# Patient Record
Sex: Male | Born: 1950 | Race: White | Hispanic: No | Smoking: Never smoker
Health system: Southern US, Community
[De-identification: ages and names within clinical notes are randomized; demographics above are authoritative.]

## PROBLEM LIST (undated history)

## (undated) DIAGNOSIS — Z973 Presence of spectacles and contact lenses: Secondary | ICD-10-CM

## (undated) DIAGNOSIS — G4730 Sleep apnea, unspecified: Secondary | ICD-10-CM

## (undated) DIAGNOSIS — R55 Syncope and collapse: Secondary | ICD-10-CM

## (undated) DIAGNOSIS — N400 Enlarged prostate without lower urinary tract symptoms: Secondary | ICD-10-CM

## (undated) DIAGNOSIS — M1990 Unspecified osteoarthritis, unspecified site: Secondary | ICD-10-CM

## (undated) DIAGNOSIS — Z8489 Family history of other specified conditions: Secondary | ICD-10-CM

## (undated) DIAGNOSIS — I10 Essential (primary) hypertension: Secondary | ICD-10-CM

## (undated) DIAGNOSIS — U071 COVID-19: Secondary | ICD-10-CM

## (undated) HISTORY — DX: Sleep apnea, unspecified: G47.30

## (undated) HISTORY — PX: APPENDECTOMY: SHX54

## (undated) HISTORY — PX: CHOLECYSTECTOMY: SHX55

## (undated) HISTORY — PX: OTHER SURGICAL HISTORY: SHX169

## (undated) HISTORY — PX: TONSILLECTOMY: SUR1361

## (undated) HISTORY — DX: Syncope and collapse: R55

---

## 2003-03-03 DIAGNOSIS — C801 Malignant (primary) neoplasm, unspecified: Secondary | ICD-10-CM

## 2003-03-03 HISTORY — DX: Malignant (primary) neoplasm, unspecified: C80.1

## 2003-07-14 HISTORY — PX: OTHER SURGICAL HISTORY: SHX169

## 2016-01-13 ENCOUNTER — Ambulatory Visit: Payer: Self-pay | Admitting: Sports Medicine

## 2016-01-27 ENCOUNTER — Ambulatory Visit (INDEPENDENT_AMBULATORY_CARE_PROVIDER_SITE_OTHER): Payer: BLUE CROSS/BLUE SHIELD | Admitting: Sports Medicine

## 2016-01-27 ENCOUNTER — Encounter: Payer: Self-pay | Admitting: Sports Medicine

## 2016-01-27 ENCOUNTER — Ambulatory Visit
Admission: RE | Admit: 2016-01-27 | Discharge: 2016-01-27 | Disposition: A | Discharge status: Home / Self Care | Payer: BLUE CROSS/BLUE SHIELD | Source: Ambulatory Visit | Attending: Sports Medicine | Admitting: Sports Medicine

## 2016-01-27 VITALS — BP 134/89 | HR 68 | Ht 73.0 in | Wt 273.0 lb

## 2016-01-27 DIAGNOSIS — G8929 Other chronic pain: Secondary | ICD-10-CM

## 2016-01-27 DIAGNOSIS — M25511 Pain in right shoulder: Secondary | ICD-10-CM

## 2016-01-27 MED ORDER — MELOXICAM 15 MG PO TABS: ORAL_TABLET | ORAL | 0 refills | Status: DC
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
## 2016-01-31 ENCOUNTER — Telehealth: Payer: Self-pay | Admitting: Sports Medicine

## 2016-01-31 NOTE — Telephone Encounter (Signed)
I spoke with Elta Guadeloupe on the phone today after reviewing the x-ray of his right shoulder. 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 He also has avascular necrosis of the right humeral head. He has yet to try his meloxicam. Based on his x-rays, I've recommended that he try to avoid overhead activities at the gym. He enjoys weight lifting, but will be limited somewhat in what he's able to do. He can also substitute over-the-counter Motrin, ibuprofen, or Aleve for the meloxicam if he would like. He is also asking about experimenting with tart cherry juice and tumeric. I think those are fine. I've also encouraged him to continue with his range of motion exercises. Definitive treatment here is a total shoulder arthroplasty or possibly a reverse total shoulder arthroplasty. He is not interested in surgery at this time. We could also consider intra-articular cortisone injections at a later date if needed. He will follow-up with me as needed.

## 2016-05-28 ENCOUNTER — Other Ambulatory Visit (HOSPITAL_COMMUNITY): Payer: Self-pay | Admitting: Internal Medicine

## 2016-05-28 DIAGNOSIS — R1011 Right upper quadrant pain: Secondary | ICD-10-CM

## 2016-05-29 ENCOUNTER — Ambulatory Visit (HOSPITAL_COMMUNITY)
Admission: RE | Admit: 2016-05-29 | Discharge: 2016-05-29 | Disposition: A | Discharge status: Home / Self Care | Payer: BLUE CROSS/BLUE SHIELD | Source: Ambulatory Visit | Attending: Internal Medicine | Admitting: Internal Medicine

## 2016-05-29 DIAGNOSIS — K760 Fatty (change of) liver, not elsewhere classified: Secondary | ICD-10-CM | POA: Insufficient documentation

## 2016-05-29 DIAGNOSIS — R1011 Right upper quadrant pain: Secondary | ICD-10-CM | POA: Diagnosis not present

## 2016-05-29 DIAGNOSIS — K8020 Calculus of gallbladder without cholecystitis without obstruction: Secondary | ICD-10-CM | POA: Diagnosis not present

## 2016-06-04 ENCOUNTER — Other Ambulatory Visit: Payer: Self-pay | Admitting: Surgery

## 2017-12-23 DIAGNOSIS — Z85820 Personal history of malignant melanoma of skin: Secondary | ICD-10-CM | POA: Diagnosis not present

## 2017-12-23 DIAGNOSIS — L814 Other melanin hyperpigmentation: Secondary | ICD-10-CM | POA: Diagnosis not present

## 2017-12-23 DIAGNOSIS — D225 Melanocytic nevi of trunk: Secondary | ICD-10-CM | POA: Diagnosis not present

## 2017-12-23 DIAGNOSIS — L570 Actinic keratosis: Secondary | ICD-10-CM | POA: Diagnosis not present

## 2018-01-14 DIAGNOSIS — R972 Elevated prostate specific antigen [PSA]: Secondary | ICD-10-CM | POA: Diagnosis not present

## 2018-01-26 DIAGNOSIS — R351 Nocturia: Secondary | ICD-10-CM | POA: Diagnosis not present

## 2018-01-26 DIAGNOSIS — Z87442 Personal history of urinary calculi: Secondary | ICD-10-CM | POA: Diagnosis not present

## 2018-01-26 DIAGNOSIS — N5201 Erectile dysfunction due to arterial insufficiency: Secondary | ICD-10-CM | POA: Diagnosis not present

## 2018-01-26 DIAGNOSIS — N401 Enlarged prostate with lower urinary tract symptoms: Secondary | ICD-10-CM | POA: Diagnosis not present

## 2018-01-26 DIAGNOSIS — R972 Elevated prostate specific antigen [PSA]: Secondary | ICD-10-CM | POA: Diagnosis not present

## 2018-03-28 DIAGNOSIS — Z0000 Encounter for general adult medical examination without abnormal findings: Secondary | ICD-10-CM | POA: Diagnosis not present

## 2018-03-28 DIAGNOSIS — Z125 Encounter for screening for malignant neoplasm of prostate: Secondary | ICD-10-CM | POA: Diagnosis not present

## 2018-03-28 DIAGNOSIS — I10 Essential (primary) hypertension: Secondary | ICD-10-CM | POA: Diagnosis not present

## 2018-03-28 DIAGNOSIS — Z1159 Encounter for screening for other viral diseases: Secondary | ICD-10-CM | POA: Diagnosis not present

## 2018-04-06 DIAGNOSIS — Z91041 Radiographic dye allergy status: Secondary | ICD-10-CM | POA: Diagnosis not present

## 2018-04-06 DIAGNOSIS — N200 Calculus of kidney: Secondary | ICD-10-CM | POA: Diagnosis not present

## 2018-04-06 DIAGNOSIS — Z880 Allergy status to penicillin: Secondary | ICD-10-CM | POA: Diagnosis not present

## 2018-04-06 DIAGNOSIS — N529 Male erectile dysfunction, unspecified: Secondary | ICD-10-CM | POA: Diagnosis not present

## 2018-04-06 DIAGNOSIS — Z85820 Personal history of malignant melanoma of skin: Secondary | ICD-10-CM | POA: Diagnosis not present

## 2018-04-06 DIAGNOSIS — I444 Left anterior fascicular block: Secondary | ICD-10-CM | POA: Diagnosis not present

## 2018-04-06 DIAGNOSIS — Z0000 Encounter for general adult medical examination without abnormal findings: Secondary | ICD-10-CM | POA: Diagnosis not present

## 2018-04-06 DIAGNOSIS — I10 Essential (primary) hypertension: Secondary | ICD-10-CM | POA: Diagnosis not present

## 2018-04-06 DIAGNOSIS — Z23 Encounter for immunization: Secondary | ICD-10-CM | POA: Diagnosis not present

## 2018-04-06 DIAGNOSIS — E785 Hyperlipidemia, unspecified: Secondary | ICD-10-CM | POA: Diagnosis not present

## 2018-08-10 DIAGNOSIS — Z23 Encounter for immunization: Secondary | ICD-10-CM | POA: Diagnosis not present

## 2018-09-28 DIAGNOSIS — R399 Unspecified symptoms and signs involving the genitourinary system: Secondary | ICD-10-CM | POA: Diagnosis not present

## 2018-09-28 DIAGNOSIS — N390 Urinary tract infection, site not specified: Secondary | ICD-10-CM | POA: Diagnosis not present

## 2018-11-21 DIAGNOSIS — D038 Melanoma in situ of other sites: Secondary | ICD-10-CM | POA: Diagnosis not present

## 2018-11-21 DIAGNOSIS — I10 Essential (primary) hypertension: Secondary | ICD-10-CM | POA: Diagnosis not present

## 2018-11-21 DIAGNOSIS — Z1211 Encounter for screening for malignant neoplasm of colon: Secondary | ICD-10-CM | POA: Diagnosis not present

## 2018-11-23 DIAGNOSIS — Z23 Encounter for immunization: Secondary | ICD-10-CM | POA: Diagnosis not present

## 2018-12-01 DIAGNOSIS — D120 Benign neoplasm of cecum: Secondary | ICD-10-CM | POA: Diagnosis not present

## 2018-12-01 DIAGNOSIS — Z1211 Encounter for screening for malignant neoplasm of colon: Secondary | ICD-10-CM | POA: Diagnosis not present

## 2018-12-01 DIAGNOSIS — K5730 Diverticulosis of large intestine without perforation or abscess without bleeding: Secondary | ICD-10-CM | POA: Diagnosis not present

## 2018-12-01 DIAGNOSIS — K635 Polyp of colon: Secondary | ICD-10-CM | POA: Diagnosis not present

## 2018-12-05 DIAGNOSIS — H40053 Ocular hypertension, bilateral: Secondary | ICD-10-CM | POA: Diagnosis not present

## 2018-12-05 DIAGNOSIS — H52203 Unspecified astigmatism, bilateral: Secondary | ICD-10-CM | POA: Diagnosis not present

## 2018-12-05 DIAGNOSIS — H1851 Endothelial corneal dystrophy: Secondary | ICD-10-CM | POA: Diagnosis not present

## 2018-12-05 DIAGNOSIS — H25813 Combined forms of age-related cataract, bilateral: Secondary | ICD-10-CM | POA: Diagnosis not present

## 2018-12-14 DIAGNOSIS — L814 Other melanin hyperpigmentation: Secondary | ICD-10-CM | POA: Diagnosis not present

## 2018-12-14 DIAGNOSIS — L570 Actinic keratosis: Secondary | ICD-10-CM | POA: Diagnosis not present

## 2018-12-14 DIAGNOSIS — D2261 Melanocytic nevi of right upper limb, including shoulder: Secondary | ICD-10-CM | POA: Diagnosis not present

## 2018-12-14 DIAGNOSIS — L821 Other seborrheic keratosis: Secondary | ICD-10-CM | POA: Diagnosis not present

## 2018-12-14 DIAGNOSIS — D225 Melanocytic nevi of trunk: Secondary | ICD-10-CM | POA: Diagnosis not present

## 2018-12-14 DIAGNOSIS — D1801 Hemangioma of skin and subcutaneous tissue: Secondary | ICD-10-CM | POA: Diagnosis not present

## 2019-04-05 DIAGNOSIS — I10 Essential (primary) hypertension: Secondary | ICD-10-CM | POA: Diagnosis not present

## 2019-04-05 DIAGNOSIS — E785 Hyperlipidemia, unspecified: Secondary | ICD-10-CM | POA: Diagnosis not present

## 2019-04-05 DIAGNOSIS — Z125 Encounter for screening for malignant neoplasm of prostate: Secondary | ICD-10-CM | POA: Diagnosis not present

## 2019-04-12 DIAGNOSIS — Z23 Encounter for immunization: Secondary | ICD-10-CM | POA: Diagnosis not present

## 2019-04-12 DIAGNOSIS — Z0000 Encounter for general adult medical examination without abnormal findings: Secondary | ICD-10-CM | POA: Diagnosis not present

## 2019-04-12 DIAGNOSIS — M25511 Pain in right shoulder: Secondary | ICD-10-CM | POA: Diagnosis not present

## 2019-04-12 DIAGNOSIS — Z6835 Body mass index (BMI) 35.0-35.9, adult: Secondary | ICD-10-CM | POA: Diagnosis not present

## 2019-04-12 DIAGNOSIS — I10 Essential (primary) hypertension: Secondary | ICD-10-CM | POA: Diagnosis not present

## 2019-05-24 DIAGNOSIS — R972 Elevated prostate specific antigen [PSA]: Secondary | ICD-10-CM | POA: Diagnosis not present

## 2019-06-01 DIAGNOSIS — R972 Elevated prostate specific antigen [PSA]: Secondary | ICD-10-CM | POA: Diagnosis not present

## 2019-06-01 DIAGNOSIS — N5201 Erectile dysfunction due to arterial insufficiency: Secondary | ICD-10-CM | POA: Diagnosis not present

## 2019-06-01 DIAGNOSIS — Z87442 Personal history of urinary calculi: Secondary | ICD-10-CM | POA: Diagnosis not present

## 2019-06-01 DIAGNOSIS — N401 Enlarged prostate with lower urinary tract symptoms: Secondary | ICD-10-CM | POA: Diagnosis not present

## 2019-06-01 DIAGNOSIS — R351 Nocturia: Secondary | ICD-10-CM | POA: Diagnosis not present

## 2019-07-10 DIAGNOSIS — M25551 Pain in right hip: Secondary | ICD-10-CM | POA: Diagnosis not present

## 2019-07-22 DIAGNOSIS — M1611 Unilateral primary osteoarthritis, right hip: Secondary | ICD-10-CM | POA: Diagnosis not present

## 2019-08-07 DIAGNOSIS — M25551 Pain in right hip: Secondary | ICD-10-CM | POA: Diagnosis not present

## 2019-08-07 DIAGNOSIS — M1611 Unilateral primary osteoarthritis, right hip: Secondary | ICD-10-CM | POA: Diagnosis not present

## 2019-08-23 DIAGNOSIS — M25551 Pain in right hip: Secondary | ICD-10-CM | POA: Diagnosis not present

## 2019-09-11 DIAGNOSIS — M25551 Pain in right hip: Secondary | ICD-10-CM | POA: Diagnosis not present

## 2019-11-09 DIAGNOSIS — M25551 Pain in right hip: Secondary | ICD-10-CM | POA: Diagnosis not present

## 2019-11-09 DIAGNOSIS — M1611 Unilateral primary osteoarthritis, right hip: Secondary | ICD-10-CM | POA: Diagnosis not present

## 2019-11-29 DIAGNOSIS — N401 Enlarged prostate with lower urinary tract symptoms: Secondary | ICD-10-CM | POA: Diagnosis not present

## 2019-11-29 DIAGNOSIS — R351 Nocturia: Secondary | ICD-10-CM | POA: Diagnosis not present

## 2019-11-29 DIAGNOSIS — R3912 Poor urinary stream: Secondary | ICD-10-CM | POA: Diagnosis not present

## 2019-12-08 DIAGNOSIS — H04123 Dry eye syndrome of bilateral lacrimal glands: Secondary | ICD-10-CM | POA: Diagnosis not present

## 2019-12-08 DIAGNOSIS — H5213 Myopia, bilateral: Secondary | ICD-10-CM | POA: Diagnosis not present

## 2019-12-08 DIAGNOSIS — H52203 Unspecified astigmatism, bilateral: Secondary | ICD-10-CM | POA: Diagnosis not present

## 2019-12-08 DIAGNOSIS — H25813 Combined forms of age-related cataract, bilateral: Secondary | ICD-10-CM | POA: Diagnosis not present

## 2019-12-15 DIAGNOSIS — Z85820 Personal history of malignant melanoma of skin: Secondary | ICD-10-CM | POA: Diagnosis not present

## 2019-12-15 DIAGNOSIS — D1801 Hemangioma of skin and subcutaneous tissue: Secondary | ICD-10-CM | POA: Diagnosis not present

## 2019-12-15 DIAGNOSIS — L570 Actinic keratosis: Secondary | ICD-10-CM | POA: Diagnosis not present

## 2019-12-15 DIAGNOSIS — L821 Other seborrheic keratosis: Secondary | ICD-10-CM | POA: Diagnosis not present

## 2019-12-15 DIAGNOSIS — D225 Melanocytic nevi of trunk: Secondary | ICD-10-CM | POA: Diagnosis not present

## 2019-12-28 DIAGNOSIS — N401 Enlarged prostate with lower urinary tract symptoms: Secondary | ICD-10-CM | POA: Diagnosis not present

## 2020-01-05 DIAGNOSIS — R972 Elevated prostate specific antigen [PSA]: Secondary | ICD-10-CM | POA: Diagnosis not present

## 2020-01-05 DIAGNOSIS — R3912 Poor urinary stream: Secondary | ICD-10-CM | POA: Diagnosis not present

## 2020-01-05 DIAGNOSIS — R351 Nocturia: Secondary | ICD-10-CM | POA: Diagnosis not present

## 2020-01-05 DIAGNOSIS — N401 Enlarged prostate with lower urinary tract symptoms: Secondary | ICD-10-CM | POA: Diagnosis not present

## 2020-03-06 DIAGNOSIS — R3912 Poor urinary stream: Secondary | ICD-10-CM | POA: Diagnosis not present

## 2020-03-06 DIAGNOSIS — N401 Enlarged prostate with lower urinary tract symptoms: Secondary | ICD-10-CM | POA: Diagnosis not present

## 2020-03-07 ENCOUNTER — Other Ambulatory Visit: Payer: Self-pay | Admitting: Urology

## 2020-03-28 DIAGNOSIS — M25551 Pain in right hip: Secondary | ICD-10-CM | POA: Diagnosis not present

## 2020-03-28 DIAGNOSIS — M6281 Muscle weakness (generalized): Secondary | ICD-10-CM | POA: Diagnosis not present

## 2020-04-02 DIAGNOSIS — M25551 Pain in right hip: Secondary | ICD-10-CM | POA: Diagnosis not present

## 2020-04-02 DIAGNOSIS — M6281 Muscle weakness (generalized): Secondary | ICD-10-CM | POA: Diagnosis not present

## 2020-04-04 DIAGNOSIS — M25551 Pain in right hip: Secondary | ICD-10-CM | POA: Diagnosis not present

## 2020-04-04 DIAGNOSIS — M6281 Muscle weakness (generalized): Secondary | ICD-10-CM | POA: Diagnosis not present

## 2020-04-09 ENCOUNTER — Encounter (HOSPITAL_BASED_OUTPATIENT_CLINIC_OR_DEPARTMENT_OTHER): Payer: Self-pay | Admitting: Urology

## 2020-04-09 ENCOUNTER — Other Ambulatory Visit: Payer: Self-pay

## 2020-04-09 NOTE — Progress Notes (Addendum)
Spoke w/ via phone for pre-op interview---pt Lab needs dos---- I stat  ekg              Lab results------none COVID test ------covid positive 03-19-2020 results on chart Arrive at -------530 am 04-12-2020 NPO after MN NO Solid Food.  Clear liquids from MN until---430 am then npo Medications to take morning of surgery -----none Diabetic medication -----n/a Patient Special Instructions -----overnight stay instructions given Pre-Op special Istructions -----none Patient verbalized understanding of instructions that were given at this phone interview. Patient denies shortness of breath, chest pain, fever, cough at this phone interview.

## 2020-04-10 DIAGNOSIS — M6281 Muscle weakness (generalized): Secondary | ICD-10-CM | POA: Diagnosis not present

## 2020-04-10 DIAGNOSIS — M25551 Pain in right hip: Secondary | ICD-10-CM | POA: Diagnosis not present

## 2020-04-11 DIAGNOSIS — M6281 Muscle weakness (generalized): Secondary | ICD-10-CM | POA: Diagnosis not present

## 2020-04-11 DIAGNOSIS — M25551 Pain in right hip: Secondary | ICD-10-CM | POA: Diagnosis not present

## 2020-04-11 NOTE — Anesthesia Preprocedure Evaluation (Addendum)
Anesthesia Evaluation  Patient identified by MRN, date of birth, ID band Patient awake    Reviewed: Allergy & Precautions, NPO status , Patient's Chart, lab work & pertinent test results  History of Anesthesia Complications Negative for: history of anesthetic complications  Airway Mallampati: II  TM Distance: >3 FB Neck ROM: Full    Dental no notable dental hx.    Pulmonary Current Smoker,    Pulmonary exam normal        Cardiovascular hypertension, Pt. on medications Normal cardiovascular exam     Neuro/Psych negative neurological ROS  negative psych ROS   GI/Hepatic negative GI ROS, Neg liver ROS,   Endo/Other  negative endocrine ROS  Renal/GU negative Renal ROS  negative genitourinary   Musculoskeletal  (+) Arthritis ,   Abdominal   Peds  Hematology negative hematology ROS (+)   Anesthesia Other Findings Day of surgery medications reviewed with patient.  Reproductive/Obstetrics negative OB ROS                            Anesthesia Physical Anesthesia Plan  ASA: II  Anesthesia Plan: General   Post-op Pain Management:    Induction: Intravenous  PONV Risk Score and Plan: 2 and Treatment may vary due to age or medical condition, Ondansetron, Dexamethasone and Midazolam  Airway Management Planned: LMA  Additional Equipment: None  Intra-op Plan:   Post-operative Plan: Extubation in OR  Informed Consent: I have reviewed the patients History and Physical, chart, labs and discussed the procedure including the risks, benefits and alternatives for the proposed anesthesia with the patient or authorized representative who has indicated his/her understanding and acceptance.     Dental advisory given  Plan Discussed with: CRNA  Anesthesia Plan Comments:        Anesthesia Quick Evaluation

## 2020-04-11 NOTE — H&P (Signed)
I have an enlarged prostate (follow-up).  HPI: Douglas Carney is a 70 year-old male established patient who is here for an enlarged prostate follow-up evaluation.    GU Hx: Douglas Carney returns today in f/u. His PSA was 4.09 on 04/05/19 with Dr. Shelia Carney but it is down to 3.31. He has a history of an elevated PSA of 3.36 in 8/10 which was a rise from 1.1 in 2009 and had a biopsy in 12/10 that was benign. His PSA prior to this visit is 3.03 which is stable. He is voiding well without complaints. He has gained back some weight. He has ED and is using sildenafil 20mg  2 tabs prn with success.   His brother has a history of prostate cancer that was diagnosed at age 83 and treated by Dr. Rosana Carney with [facility].   He has had 2 prior stones which he passed. He has no stone symptoms or hematuria since his last visit. He has had no associated signs or symptoms   Interval 01/05/20: Patient with above-noted history. He returns today for follow-up. He was started on alfuzosin at prior office visit. He states he has noted significant improvement to lower urinary tract symptoms. He is noted improvement in the strength of his urinary stream and feels he is emptying his bladder more efficiently. He states that he does continue to have some postvoid dribbling and spraying of his urinary stream with voiding. Daytime frequency and urgency have improved. Nocturia is also back to x1, which is baseline. He denies dysuria or gross hematuria. No complaints of fever, chills, nausea, or vomiting. PSA was noted to be decreased to 2.80.   03/06/20: Douglas Carney returns today for a prostate [place] and cystoscopy for further evaluation of his LUTS. He remains on alfuzosin. He reports and spraying stream and some post void dribbling. It is better on alfuzosin. His Prostate volume on [place] today is 21ml.     CC: AUA Questions Scoring.  HPI:     AUA Symptom Score: Less than 20% of the time he has the sensation of not emptying his bladder completely when finished  urinating. Less than 50% of the time he has to urinate again fewer than two hours after he has finished urinating. 50% of the time he has to start and stop again several times when he urinates. Less than 50% of the time he finds it difficult to postpone urination. 50% of the time he has a weak urinary stream. Less than 50% of the time he has to push or strain to begin urination. He has to get up to urinate 2 times from the time he goes to bed until the time he gets up in the morning.   Calculated AUA Symptom Score: 15    ALLERGIES: Contrast Dye Penicillins - Skin Rash, childhood allergy, has had since without problem per pt    MEDICATIONS: Alfuzosin Hcl Er 10 mg tablet, extended release 24 hr 1 tablet PO Daily  Aspirin 325 MG Oral Tablet Oral PRN  Ibuprofen TABS Oral PRN  Losartan Potassium 50 mg tablet  Meloxicam 15 mg tablet  Sildenafil Citrate 20 mg tablet 1-5 tablets as needed     GU PSH: Complex Uroflow - 01/05/2020       PSH Notes: Tonsillectomy, Appendectomy, Shoulder Surgery   NON-GU PSH: Appendectomy - 2010 Cholecystectomy (laparoscopic) Remove Tonsils - 2010     GU PMH: BPH w/LUTS - 01/05/2020, I am going try him on alfuzosin and reviewed the side effects. He will return in about  a month with a PSA for a flowrate and PVR. , - 11/29/2019, 2016-06-14 Elevated PSA - 01/05/2020, Douglas Carney's PSA is back down to a more baseline level. I will have him return in a year with a PSA. , - 06/01/2019 (Stable), His PSA is stable and his exam is benign. , - 01/26/2018 (Worsening), His PSA is up to 4.1 but his exam is benign. I will repeat the PSA after a week of abstinence. If it is still up I will get an MRIP and consider a repeat biopsy. , 2016-06-14 (Improving), PSA is down and exam is benign., June 15, 2015, Elevated prostate specific antigen (PSA), - 2014-06-15 Nocturia - 01/05/2020, - 11/29/2019, He has nocturia x 1 since his cholecystectomy. , 06-14-2016 Weak Urinary Stream - 01/05/2020, - 11/29/2019 Microscopic  hematuria - 2015/06/15 ED due to arterial insufficiency, Erectile dysfunction due to arterial insufficiency - June 15, 2014 History of urolithiasis, Nephrolithiasis - June 14, 2012      PMH Notes:  2012-12-24 05:23:40 - Note: Cholelithiasis  2008-11-16 12:26:42 - Note: Osteomyelitis  2008-11-16 12:31:50 - Note: Arm Swelling Right   NON-GU PMH: Encounter for general adult medical examination without abnormal findings, Encounter for preventive health examination - 2014-06-15 Personal history of other diseases of the circulatory system, History of hypertension - 2012/06/14 Personal history of other diseases of the musculoskeletal system and connective tissue, History of low back pain - 2012/06/14    FAMILY HISTORY: Death In The Family Father - Runs In Family Death In The Family Mother - Runs In Family Family Health Status Number - Runs In Family Malignant Melanoma Of The Skin - Father non-Hodgkin's lymphoma - Mother Prostate Cancer - Brother    Notes: Father was an ENT Dr.   SOCIAL HISTORY: Marital Status: Single Preferred Language: English; Ethnicity: Not Hispanic Or Latino; Race: White Current Smoking Status: Patient has never smoked.   Tobacco Use Assessment Completed: Used Tobacco in last 30 days?     Notes: Never A Smoker, Occupation:, Alcohol Use, Tobacco Use   REVIEW OF SYSTEMS:    GU Review Male:   Patient denies frequent urination, hard to postpone urination, burning/ pain with urination, get up at night to urinate, leakage of urine, stream starts and stops, trouble starting your stream, have to strain to urinate , erection problems, and penile pain.  Gastrointestinal (Upper):   Patient denies nausea, vomiting, and indigestion/ heartburn.  Gastrointestinal (Lower):   Patient denies diarrhea and constipation.  Constitutional:   Patient denies fever, night sweats, weight loss, and fatigue.  Skin:   Patient denies skin rash/ lesion and itching.  Eyes:   Patient denies blurred vision and double vision.  Ears/ Nose/  Throat:   Patient denies sore throat and sinus problems.  Hematologic/Lymphatic:   Patient denies swollen glands and easy bruising.  Cardiovascular:   Patient denies leg swelling and chest pains.  Respiratory:   Patient denies cough and shortness of breath.  Endocrine:   Patient denies excessive thirst.  Musculoskeletal:   Patient denies back pain and joint pain.  Neurological:   Patient denies headaches and dizziness.  Psychologic:   Patient denies depression and anxiety.   Notes: weak stream dribbling    VITAL SIGNS:      03/06/2020 08:55 AM  BP 146/84 mmHg  Heart Rate 66 /min  Temperature 98.0 F / 36.6 C   MULTI-SYSTEM PHYSICAL EXAMINATION:    Constitutional: Well-nourished. No physical deformities. Normally developed. Good grooming.  Respiratory: Normal breath sounds. No labored breathing, no use  of accessory muscles.   Cardiovascular: Regular rate and rhythm. No murmur, no gallop.      Complexity of Data:  Records Review:   AUA Symptom Score, Previous Patient Records  Urine Test Review:   Urinalysis   12/28/19 05/24/19 04/05/19 01/14/18 01/25/17 01/08/17 12/06/15 05/15/14  PSA  Total PSA 2.80 ng/mL 3.31 ng/mL 4.09 ng/dl 3.03 ng/mL 2.69 ng/mL 4.1 ng/dl 2.29 ng/dl 2.48   Free PSA        0.25   % Free PSA        10     PROCEDURES:         Flexible Cystoscopy - 52000  Risks, benefits, and some of the potential complications of the procedure were discussed. 21ml of 2% lidocaine jelly was instilled intraurethrally.  Cipro 500mg  given for antibiotic prophylaxis.     Meatus:  Normal size. Normal location. Normal condition.  Urethra:  No strictures.  External Sphincter:  Normal.  Verumontanum:  Normal.  Prostate:  Obstructing. Enlarged median lobe. No hyperplasia.  Bladder Neck:  Non-obstructing.  Ureteral Orifices:  Normal location. Normal size. Normal shape. Effluxed clear urine.  Bladder:  Moderate trabeculation. No tumors. Normal mucosa. No stones. small cellules.        The procedure was well tolerated and there were no complications.          Prostate Ultrasound - 34193  Length:5.58cm Height:4.67cm Width:5.27cm Volume:71.32ml      Several Subcentimeter Cystic Areas throughout Prostate - largest measuring 0.38cm Patient confirmed No Neulasta OnPro Device.    The transrectal ultrasound probe is introduced into the rectum, and the prostate is visualized. Ultrasonography is utilized throughout the procedure. At the conclusion of the procedure, the ultrasound probe is removed. The patient tolerates the procedure without complication.         Urinalysis Dipstick Dipstick Cont'd  Color: Yellow Bilirubin: Neg mg/dL  Appearance: Clear Ketones: Neg mg/dL  Specific Gravity: 1.025 Blood: Neg ery/uL  pH: 5.5 Protein: Neg mg/dL  Glucose: Neg mg/dL Urobilinogen: 0.2 mg/dL    Nitrites: Neg    Leukocyte Esterase: Neg leu/uL    ASSESSMENT:      ICD-10 Details  1 GU:   BPH w/LUTS - N40.1 Chronic, Worsening - He got some improvement with the alfuzosin but is interested in a more definitive option. I discuss adding finasteride but also reviewed Urolift, Rezum and TURP. He is interested in the TURP and I will get that scheduled. I reviewd the risks of a TURP including bleeding, infection, incontinence, stricture, need for secondary procedures, ejaculatory and erectile dysfunction, thrombotic events, fluid overload and anesthetic complications. I explained that 95% of men will have relief of the obstructive symptoms and about 70% will have relief of the irritative symptoms.   2   Weak Urinary Stream - R39.12 Chronic, Worsening   PLAN:           Schedule Return Visit/Planned Activity: Next Available Appointment - Schedule Surgery  Procedure: Unspecified Date - Cystoscopy TURP - 79024          Document Letter(s):  Created for Patient: Clinical Summary   I have an enlarged prostate (follow-up).  HPI: Douglas Carney is a 70 year-old male established patient  who is here for an enlarged prostate follow-up evaluation.    GU Hx: Douglas Carney returns today in f/u. His PSA was 4.09 on 04/05/19 with Dr. Shelia Carney but it is down to 3.31. He has a history of an elevated PSA of 3.36 in 8/10 which was  a rise from 1.1 in 2009 and had a biopsy in 12/10 that was benign. His PSA prior to this visit is 3.03 which is stable. He is voiding well without complaints. He has gained back some weight. He has ED and is using sildenafil 20mg  2 tabs prn with success.   His brother has a history of prostate cancer that was diagnosed at age 1 and treated by Dr. Rosana Carney with [facility].   He has had 2 prior stones which he passed. He has no stone symptoms or hematuria since his last visit. He has had no associated signs or symptoms   Interval 01/05/20: Patient with above-noted history. He returns today for follow-up. He was started on alfuzosin at prior office visit. He states he has noted significant improvement to lower urinary tract symptoms. He is noted improvement in the strength of his urinary stream and feels he is emptying his bladder more efficiently. He states that he does continue to have some postvoid dribbling and spraying of his urinary stream with voiding. Daytime frequency and urgency have improved. Nocturia is also back to x1, which is baseline. He denies dysuria or gross hematuria. No complaints of fever, chills, nausea, or vomiting. PSA was noted to be decreased to 2.80.   03/06/20: Jaydin returns today for a prostate [place] and cystoscopy for further evaluation of his LUTS. He remains on alfuzosin. He reports and spraying stream and some post void dribbling. It is better on alfuzosin. His Prostate volume on [place] today is 49ml.     CC: AUA Questions Scoring.  HPI:     AUA Symptom Score: Less than 20% of the time he has the sensation of not emptying his bladder completely when finished urinating. Less than 50% of the time he has to urinate again fewer than two hours after he has finished  urinating. 50% of the time he has to start and stop again several times when he urinates. Less than 50% of the time he finds it difficult to postpone urination. 50% of the time he has a weak urinary stream. Less than 50% of the time he has to push or strain to begin urination. He has to get up to urinate 2 times from the time he goes to bed until the time he gets up in the morning.   Calculated AUA Symptom Score: 15    ALLERGIES: Contrast Dye Penicillins - Skin Rash, childhood allergy, has had since without problem per pt    MEDICATIONS: Alfuzosin Hcl Er 10 mg tablet, extended release 24 hr 1 tablet PO Daily  Aspirin 325 MG Oral Tablet Oral PRN  Ibuprofen TABS Oral PRN  Losartan Potassium 50 mg tablet  Meloxicam 15 mg tablet  Sildenafil Citrate 20 mg tablet 1-5 tablets as needed     GU PSH: Complex Uroflow - 01/05/2020       PSH Notes: Tonsillectomy, Appendectomy, Shoulder Surgery   NON-GU PSH: Appendectomy - 2010 Cholecystectomy (laparoscopic) Remove Tonsils - 2010     GU PMH: BPH w/LUTS - 01/05/2020, I am going try him on alfuzosin and reviewed the side effects. He will return in about a month with a PSA for a flowrate and PVR. , - 11/29/2019, - 2018 Elevated PSA - 01/05/2020, Douglas Carney's PSA is back down to a more baseline level. I will have him return in a year with a PSA. , - 06/01/2019 (Stable), His PSA is stable and his exam is benign. , - 01/26/2018 (Worsening), His PSA is up to 4.1 but his  exam is benign. I will repeat the PSA after a week of abstinence. If it is still up I will get an MRIP and consider a repeat biopsy. , 06-18-2016 (Improving), PSA is down and exam is benign., 06/19/2015, Elevated prostate specific antigen (PSA), - 06/19/2014 Nocturia - 01/05/2020, - 11/29/2019, He has nocturia x 1 since his cholecystectomy. , June 18, 2016 Weak Urinary Stream - 01/05/2020, - 11/29/2019 Microscopic hematuria - 19-Jun-2015 ED due to arterial insufficiency, Erectile dysfunction due to arterial insufficiency -  19-Jun-2014 History of urolithiasis, Nephrolithiasis - June 18, 2012      PMH Notes:  2012-12-24 05:23:40 - Note: Cholelithiasis  2008-11-16 12:26:42 - Note: Osteomyelitis  2008-11-16 12:31:50 - Note: Arm Swelling Right   NON-GU PMH: Encounter for general adult medical examination without abnormal findings, Encounter for preventive health examination - 06/19/14 Personal history of other diseases of the circulatory system, History of hypertension - 06-18-12 Personal history of other diseases of the musculoskeletal system and connective tissue, History of low back pain - 06/18/12    FAMILY HISTORY: Death In The Family Father - Runs In Family Death In The Family Mother - Runs In Family Family Health Status Number - Runs In Family Malignant Melanoma Of The Skin - Father non-Hodgkin's lymphoma - Mother Prostate Cancer - Brother    Notes: Father was an ENT Dr.   SOCIAL HISTORY: Marital Status: Single Preferred Language: English; Ethnicity: Not Hispanic Or Latino; Race: White Current Smoking Status: Patient has never smoked.   Tobacco Use Assessment Completed: Used Tobacco in last 30 days?     Notes: Never A Smoker, Occupation:, Alcohol Use, Tobacco Use   REVIEW OF SYSTEMS:    GU Review Male:   Patient denies frequent urination, hard to postpone urination, burning/ pain with urination, get up at night to urinate, leakage of urine, stream starts and stops, trouble starting your stream, have to strain to urinate , erection problems, and penile pain.  Gastrointestinal (Upper):   Patient denies nausea, vomiting, and indigestion/ heartburn.  Gastrointestinal (Lower):   Patient denies diarrhea and constipation.  Constitutional:   Patient denies fever, night sweats, weight loss, and fatigue.  Skin:   Patient denies skin rash/ lesion and itching.  Eyes:   Patient denies blurred vision and double vision.  Ears/ Nose/ Throat:   Patient denies sore throat and sinus problems.  Hematologic/Lymphatic:   Patient denies swollen  glands and easy bruising.  Cardiovascular:   Patient denies leg swelling and chest pains.  Respiratory:   Patient denies cough and shortness of breath.  Endocrine:   Patient denies excessive thirst.  Musculoskeletal:   Patient denies back pain and joint pain.  Neurological:   Patient denies headaches and dizziness.  Psychologic:   Patient denies depression and anxiety.   Notes: weak stream dribbling    VITAL SIGNS:      03/06/2020 08:55 AM  BP 146/84 mmHg  Heart Rate 66 /min  Temperature 98.0 F / 36.6 C   MULTI-SYSTEM PHYSICAL EXAMINATION:    Constitutional: Well-nourished. No physical deformities. Normally developed. Good grooming.  Respiratory: Normal breath sounds. No labored breathing, no use of accessory muscles.   Cardiovascular: Regular rate and rhythm. No murmur, no gallop.      Complexity of Data:  Records Review:   AUA Symptom Score, Previous Patient Records  Urine Test Review:   Urinalysis   12/28/19 05/24/19 04/05/19 01/14/18 01/25/17 01/08/17 12/06/15 05/15/14  PSA  Total PSA 2.80 ng/mL 3.31 ng/mL 4.09 ng/dl 3.03 ng/mL 2.69 ng/mL  4.1 ng/dl 2.29 ng/dl 2.48   Free PSA        0.25   % Free PSA        10     PROCEDURES:         Flexible Cystoscopy - 52000  Risks, benefits, and some of the potential complications of the procedure were discussed. 71ml of 2% lidocaine jelly was instilled intraurethrally.  Cipro 500mg  given for antibiotic prophylaxis.     Meatus:  Normal size. Normal location. Normal condition.  Urethra:  No strictures.  External Sphincter:  Normal.  Verumontanum:  Normal.  Prostate:  Obstructing. Enlarged median lobe. No hyperplasia.  Bladder Neck:  Non-obstructing.  Ureteral Orifices:  Normal location. Normal size. Normal shape. Effluxed clear urine.  Bladder:  Moderate trabeculation. No tumors. Normal mucosa. No stones. small cellules.       The procedure was well tolerated and there were no complications.          Prostate Ultrasound -  15056  Length:5.58cm Height:4.67cm Width:5.27cm Volume:71.58ml      Several Subcentimeter Cystic Areas throughout Prostate - largest measuring 0.38cm Patient confirmed No Neulasta OnPro Device.    The transrectal ultrasound probe is introduced into the rectum, and the prostate is visualized. Ultrasonography is utilized throughout the procedure. At the conclusion of the procedure, the ultrasound probe is removed. The patient tolerates the procedure without complication.         Urinalysis Dipstick Dipstick Cont'd  Color: Yellow Bilirubin: Neg mg/dL  Appearance: Clear Ketones: Neg mg/dL  Specific Gravity: 1.025 Blood: Neg ery/uL  pH: 5.5 Protein: Neg mg/dL  Glucose: Neg mg/dL Urobilinogen: 0.2 mg/dL    Nitrites: Neg    Leukocyte Esterase: Neg leu/uL    ASSESSMENT:      ICD-10 Details  1 GU:   BPH w/LUTS - N40.1 Chronic, Worsening - He got some improvement with the alfuzosin but is interested in a more definitive option. I discuss adding finasteride but also reviewed Urolift, Rezum and TURP. He is interested in the TURP and I will get that scheduled. I reviewd the risks of a TURP including bleeding, infection, incontinence, stricture, need for secondary procedures, ejaculatory and erectile dysfunction, thrombotic events, fluid overload and anesthetic complications. I explained that 95% of men will have relief of the obstructive symptoms and about 70% will have relief of the irritative symptoms.   2   Weak Urinary Stream - R39.12 Chronic, Worsening   PLAN:           Schedule Return Visit/Planned Activity: Next Available Appointment - Schedule Surgery  Procedure: Unspecified Date - Cystoscopy TURP - 97948          Document Letter(s):  Created for Patient: Clinical Summary

## 2020-04-12 ENCOUNTER — Encounter (HOSPITAL_BASED_OUTPATIENT_CLINIC_OR_DEPARTMENT_OTHER): Payer: Self-pay | Admitting: Urology

## 2020-04-12 ENCOUNTER — Ambulatory Visit (HOSPITAL_BASED_OUTPATIENT_CLINIC_OR_DEPARTMENT_OTHER): Payer: Medicare HMO | Admitting: Anesthesiology

## 2020-04-12 ENCOUNTER — Observation Stay (HOSPITAL_BASED_OUTPATIENT_CLINIC_OR_DEPARTMENT_OTHER)
Admission: RE | Admit: 2020-04-12 | Discharge: 2020-04-13 | Disposition: A | Discharge status: Home / Self Care | Payer: Medicare HMO | Attending: Urology | Admitting: Urology

## 2020-04-12 ENCOUNTER — Encounter (HOSPITAL_BASED_OUTPATIENT_CLINIC_OR_DEPARTMENT_OTHER)
Admission: RE | Disposition: A | Discharge status: Home / Self Care | Payer: Self-pay | Source: Home / Self Care | Attending: Urology

## 2020-04-12 DIAGNOSIS — N320 Bladder-neck obstruction: Secondary | ICD-10-CM | POA: Diagnosis not present

## 2020-04-12 DIAGNOSIS — Z7982 Long term (current) use of aspirin: Secondary | ICD-10-CM | POA: Insufficient documentation

## 2020-04-12 DIAGNOSIS — N401 Enlarged prostate with lower urinary tract symptoms: Secondary | ICD-10-CM | POA: Diagnosis not present

## 2020-04-12 DIAGNOSIS — R3912 Poor urinary stream: Secondary | ICD-10-CM | POA: Diagnosis not present

## 2020-04-12 DIAGNOSIS — I10 Essential (primary) hypertension: Secondary | ICD-10-CM | POA: Diagnosis not present

## 2020-04-12 DIAGNOSIS — N138 Other obstructive and reflux uropathy: Secondary | ICD-10-CM | POA: Insufficient documentation

## 2020-04-12 DIAGNOSIS — Z79899 Other long term (current) drug therapy: Secondary | ICD-10-CM | POA: Diagnosis not present

## 2020-04-12 DIAGNOSIS — N400 Enlarged prostate without lower urinary tract symptoms: Secondary | ICD-10-CM | POA: Diagnosis not present

## 2020-04-12 HISTORY — DX: Essential (primary) hypertension: I10

## 2020-04-12 HISTORY — DX: Benign prostatic hyperplasia without lower urinary tract symptoms: N40.0

## 2020-04-12 HISTORY — DX: Unspecified osteoarthritis, unspecified site: M19.90

## 2020-04-12 HISTORY — DX: Family history of other specified conditions: Z84.89

## 2020-04-12 HISTORY — DX: COVID-19: U07.1

## 2020-04-12 HISTORY — DX: Presence of spectacles and contact lenses: Z97.3

## 2020-04-12 HISTORY — PX: TRANSURETHRAL RESECTION OF PROSTATE: SHX73

## 2020-04-12 LAB — POCT I-STAT, CHEM 8
BUN: 19 mg/dL (ref 8–23)
Calcium, Ion: 1.28 mmol/L (ref 1.15–1.40)
Chloride: 106 mmol/L (ref 98–111)
Creatinine, Ser: 0.8 mg/dL (ref 0.61–1.24)
Glucose, Bld: 115 mg/dL — ABNORMAL HIGH (ref 70–99)
HCT: 44 % (ref 39.0–52.0)
Hemoglobin: 15 g/dL (ref 13.0–17.0)
Potassium: 3.7 mmol/L (ref 3.5–5.1)
Sodium: 142 mmol/L (ref 135–145)
TCO2: 22 mmol/L (ref 22–32)

## 2020-04-12 SURGERY — TURP (TRANSURETHRAL RESECTION OF PROSTATE)
Anesthesia: General | Site: Prostate

## 2020-04-12 MED ORDER — SODIUM CHLORIDE 0.9 % IR SOLN
3000.0000 mL | Status: DC
  Administered 2020-04-12 (×2): 3000 mL

## 2020-04-12 MED ORDER — PHENYLEPHRINE HCL (PRESSORS) 10 MG/ML IV SOLN
INTRAVENOUS | Status: DC | PRN
  Administered 2020-04-12 (×3): 120 ug via INTRAVENOUS
  Administered 2020-04-12: 80 ug via INTRAVENOUS

## 2020-04-12 MED ORDER — SODIUM CHLORIDE 0.9 % IR SOLN
Status: DC | PRN
  Administered 2020-04-12: 3000 mL via INTRAVESICAL
  Administered 2020-04-12 (×2): 6000 mL via INTRAVESICAL

## 2020-04-12 MED ORDER — OXYBUTYNIN CHLORIDE 5 MG PO TABS: 5.0000 mg | ORAL_TABLET | Freq: Three times a day (TID) | ORAL | Status: DC | PRN

## 2020-04-12 MED ORDER — ACETAMINOPHEN 325 MG PO TABS
650.0000 mg | ORAL_TABLET | ORAL | Status: DC | PRN
  Administered 2020-04-13 (×2): 650 mg via ORAL

## 2020-04-12 MED ORDER — OXYCODONE HCL 5 MG/5ML PO SOLN: 5.0000 mg | Freq: Once | ORAL | Status: DC | PRN

## 2020-04-12 MED ORDER — ACETAMINOPHEN 500 MG PO TABS
ORAL_TABLET | ORAL | Status: AC
  Filled 2020-04-12: qty 2

## 2020-04-12 MED ORDER — ONDANSETRON HCL 4 MG/2ML IJ SOLN
INTRAMUSCULAR | Status: DC | PRN
  Administered 2020-04-12: 4 mg via INTRAVENOUS

## 2020-04-12 MED ORDER — FENTANYL CITRATE (PF) 100 MCG/2ML IJ SOLN: 25.0000 ug | INTRAMUSCULAR | Status: DC | PRN

## 2020-04-12 MED ORDER — LIDOCAINE HCL (CARDIAC) PF 100 MG/5ML IV SOSY
PREFILLED_SYRINGE | INTRAVENOUS | Status: DC | PRN
  Administered 2020-04-12: 100 mg via INTRAVENOUS

## 2020-04-12 MED ORDER — CIPROFLOXACIN IN D5W 400 MG/200ML IV SOLN
400.0000 mg | INTRAVENOUS | Status: AC
  Administered 2020-04-12: 400 mg via INTRAVENOUS

## 2020-04-12 MED ORDER — SENNOSIDES-DOCUSATE SODIUM 8.6-50 MG PO TABS
1.0000 | ORAL_TABLET | Freq: Every evening | ORAL | Status: DC | PRN
  Filled 2020-04-12: qty 1

## 2020-04-12 MED ORDER — DEXAMETHASONE SODIUM PHOSPHATE 10 MG/ML IJ SOLN
INTRAMUSCULAR | Status: AC
  Filled 2020-04-12: qty 1

## 2020-04-12 MED ORDER — CIPROFLOXACIN IN D5W 400 MG/200ML IV SOLN
INTRAVENOUS | Status: AC
  Filled 2020-04-12: qty 200

## 2020-04-12 MED ORDER — HYDROMORPHONE HCL 1 MG/ML IJ SOLN: 0.5000 mg | INTRAMUSCULAR | Status: DC | PRN

## 2020-04-12 MED ORDER — FLEET ENEMA 7-19 GM/118ML RE ENEM: 1.0000 | ENEMA | Freq: Once | RECTAL | Status: DC | PRN

## 2020-04-12 MED ORDER — POTASSIUM CHLORIDE IN NACL 20-0.45 MEQ/L-% IV SOLN
INTRAVENOUS | Status: DC
  Filled 2020-04-12 (×4): qty 1000

## 2020-04-12 MED ORDER — MENTHOL 3 MG MT LOZG
LOZENGE | OROMUCOSAL | Status: AC
  Filled 2020-04-12: qty 9

## 2020-04-12 MED ORDER — FENTANYL CITRATE (PF) 100 MCG/2ML IJ SOLN
INTRAMUSCULAR | Status: DC | PRN
  Administered 2020-04-12 (×2): 25 ug via INTRAVENOUS
  Administered 2020-04-12 (×4): 50 ug via INTRAVENOUS

## 2020-04-12 MED ORDER — STERILE WATER FOR IRRIGATION IR SOLN
Status: DC | PRN
  Administered 2020-04-12: 500 mL

## 2020-04-12 MED ORDER — LACTATED RINGERS IV SOLN: INTRAVENOUS | Status: DC

## 2020-04-12 MED ORDER — LOSARTAN POTASSIUM 50 MG PO TABS
50.0000 mg | ORAL_TABLET | Freq: Every day | ORAL | Status: DC
  Administered 2020-04-12: 50 mg via ORAL
  Filled 2020-04-12 (×3): qty 1

## 2020-04-12 MED ORDER — PROMETHAZINE HCL 25 MG/ML IJ SOLN: 6.2500 mg | INTRAMUSCULAR | Status: DC | PRN

## 2020-04-12 MED ORDER — FENTANYL CITRATE (PF) 250 MCG/5ML IJ SOLN
INTRAMUSCULAR | Status: AC
  Filled 2020-04-12: qty 5

## 2020-04-12 MED ORDER — ZOLPIDEM TARTRATE 5 MG PO TABS: 5.0000 mg | ORAL_TABLET | Freq: Every evening | ORAL | Status: DC | PRN

## 2020-04-12 MED ORDER — BISACODYL 10 MG RE SUPP: 10.0000 mg | Freq: Every day | RECTAL | Status: DC | PRN

## 2020-04-12 MED ORDER — EPHEDRINE SULFATE 50 MG/ML IJ SOLN
INTRAMUSCULAR | Status: DC | PRN
  Administered 2020-04-12: 10 mg via INTRAVENOUS
  Administered 2020-04-12 (×3): 5 mg via INTRAVENOUS

## 2020-04-12 MED ORDER — ACETAMINOPHEN 500 MG PO TABS
1000.0000 mg | ORAL_TABLET | Freq: Once | ORAL | Status: AC
  Administered 2020-04-12: 1000 mg via ORAL

## 2020-04-12 MED ORDER — DEXAMETHASONE SODIUM PHOSPHATE 4 MG/ML IJ SOLN
INTRAMUSCULAR | Status: DC | PRN
  Administered 2020-04-12: 8 mg via INTRAVENOUS

## 2020-04-12 MED ORDER — PROPOFOL 10 MG/ML IV BOLUS
INTRAVENOUS | Status: AC
  Filled 2020-04-12: qty 40

## 2020-04-12 MED ORDER — ONDANSETRON HCL 4 MG/2ML IJ SOLN: 4.0000 mg | INTRAMUSCULAR | Status: DC | PRN

## 2020-04-12 MED ORDER — PROPOFOL 10 MG/ML IV BOLUS
INTRAVENOUS | Status: DC | PRN
  Administered 2020-04-12: 200 mg via INTRAVENOUS

## 2020-04-12 MED ORDER — OXYCODONE HCL 5 MG PO TABS: 5.0000 mg | ORAL_TABLET | Freq: Once | ORAL | Status: DC | PRN

## 2020-04-12 MED ORDER — OXYCODONE HCL 5 MG PO TABS: 5.0000 mg | ORAL_TABLET | ORAL | Status: DC | PRN

## 2020-04-12 MED ORDER — MENTHOL 3 MG MT LOZG
1.0000 | LOZENGE | OROMUCOSAL | Status: DC | PRN
  Administered 2020-04-12: 3 mg via ORAL

## 2020-04-12 MED ORDER — ONDANSETRON HCL 4 MG/2ML IJ SOLN
INTRAMUSCULAR | Status: AC
  Filled 2020-04-12: qty 2

## 2020-04-12 SURGICAL SUPPLY — 29 items
BAG DRAIN URO-CYSTO SKYTR STRL (DRAIN) ×2 IMPLANT
BAG DRN RND TRDRP ANRFLXCHMBR (UROLOGICAL SUPPLIES) ×1
BAG DRN UROCATH (DRAIN) ×1
BAG URINE DRAIN 2000ML AR STRL (UROLOGICAL SUPPLIES) ×2 IMPLANT
BAG URINE LEG 500ML (DRAIN) IMPLANT
CATH FOLEY 2WAY SLVR  5CC 20FR (CATHETERS)
CATH FOLEY 2WAY SLVR  5CC 22FR (CATHETERS)
CATH FOLEY 2WAY SLVR 5CC 20FR (CATHETERS) IMPLANT
CATH FOLEY 2WAY SLVR 5CC 22FR (CATHETERS) IMPLANT
CATH FOLEY 3WAY 30CC 22FR (CATHETERS) ×4 IMPLANT
CATH HEMA 3WAY 30CC 24FR COUDE (CATHETERS) IMPLANT
CATH HEMA 3WAY 30CC 24FR RND (CATHETERS) IMPLANT
CLOTH BEACON ORANGE TIMEOUT ST (SAFETY) ×2 IMPLANT
EVACUATOR MICROVAS BLADDER (UROLOGICAL SUPPLIES) IMPLANT
GLOVE SURG POLYISO LF SZ8 (GLOVE) ×2 IMPLANT
GOWN STRL REUS W/TWL LRG LVL3 (GOWN DISPOSABLE) ×2 IMPLANT
HOLDER FOLEY CATH W/STRAP (MISCELLANEOUS) ×2 IMPLANT
IV NS IRRIG 3000ML ARTHROMATIC (IV SOLUTION) ×12 IMPLANT
KIT TURNOVER CYSTO (KITS) ×2 IMPLANT
LOOP CUT BIPOLAR 24F LRG (ELECTROSURGICAL) ×2 IMPLANT
MANIFOLD NEPTUNE II (INSTRUMENTS) ×2 IMPLANT
PACK CYSTO (CUSTOM PROCEDURE TRAY) ×2 IMPLANT
PLUG CATH AND CAP STER (CATHETERS) ×2 IMPLANT
SYR 30ML LL (SYRINGE) ×2 IMPLANT
SYR TOOMEY IRRIG 70ML (MISCELLANEOUS) ×2
SYRINGE TOOMEY IRRIG 70ML (MISCELLANEOUS) ×1 IMPLANT
TUBE CONNECTING 12X1/4 (SUCTIONS) IMPLANT
TUBING UROLOGY SET (TUBING) IMPLANT
WATER STERILE IRR 500ML POUR (IV SOLUTION) ×2 IMPLANT

## 2020-04-12 NOTE — Discharge Instructions (Signed)
Transurethral Resection of the Prostate, Care After °This sheet gives you information about how to care for yourself after your procedure. Your health care provider may also give you more specific instructions. If you have problems or questions, contact your health care provider. °What can I expect after the procedure? °After the procedure, it is common to have: °· Mild pain in your lower abdomen. °· Soreness or mild discomfort in your penis from having the catheter inserted during the procedure. °· A feeling of urgency when you need to urinate. °· A small amount of blood in your urine. You may notice some small blood clots in your urine. These are normal. °Follow these instructions at home: °Medicines °· Take over-the-counter and prescription medicines only as told by your health care provider. °· If you were prescribed an antibiotic medicine, take it as told by your health care provider. Do not stop taking the antibiotic even if you start to feel better. °· Ask your health care provider if the medicine prescribed to you: °? Requires you to avoid driving or using heavy machinery. °? Can cause constipation. You may need to take actions to prevent or treat constipation, such as: °§ Take over-the-counter or prescription medicines. °§ Eat foods that are high in fiber, such as fresh fruits and vegetables, whole grains, and beans. °§ Limit foods that are high in fat and processed sugars, such as fried or sweet foods. °· Do not drive for 24 hours if you were given a sedative during your procedure. °Activity °· Return to your normal activities as told by your health care provider. Ask your health care provider what activities are safe for you. °· Do not lift anything that is heavier than 10 lb (4.5 kg), or the limit that you are told, for 3 weeks after the procedure or until your health care provider says that it is safe. °· Avoid intense physical activity for as long as told by your health care provider. °· Avoid sitting  for a long time without moving. Get up and move around one or more times every few hours. This helps to prevent blood clots. You may increase your physical activity gradually as you start to feel better.   °Lifestyle °· Do not drink alcohol for as long as told by your health care provider. This is especially important if you are taking prescription pain medicines. °· Do not engage in sexual activity until your health care provider says that you can do this. °General instructions °· Do not take baths, swim, or use a hot tub until your health care provider approves. °· Drink enough fluid to keep your urine pale yellow. °· Urinate as soon as you feel the need to. Do not try to hold your urine for long periods of time. °· If your health care provider approves, you may take a stool softener for 2-3 weeks to prevent you from straining to have a bowel movement. °· Wear compression stockings as told by your health care provider. These stockings help to prevent blood clots and reduce swelling in your legs. °· Keep all follow-up visits as told by your health care provider. This is important.   °Contact a health care provider if you have: °· Difficulty urinating. °· A fever. °· Pain that gets worse or does not improve with medicine. °· Blood in your urine that does not go away after 1 week of resting and drinking more fluids. °· Swelling in your penis or testicles. °Get help right away if: °· You   are unable to urinate. °· You are having more blood clots in your urine instead of fewer. °· You have: °? Large blood clots. °? A lot of blood in your urine. °? Pain in your back or lower abdomen. °? Pain or swelling in your legs. °? Chills and you are shaking. °? Difficulty breathing or shortness of breath. °Summary °· After the procedure, it is common to have a small amount of blood in your urine. °· Avoid heavy lifting and intense physical activity for as long as told by your health care provider. °· Urinate as soon as you feel the  need to. Do not try to hold your urine for long periods of time. °· Keep all follow-up visits as told by your health care provider. This is important. °This information is not intended to replace advice given to you by your health care provider. Make sure you discuss any questions you have with your health care provider. °Document Revised: 06/08/2018 Document Reviewed: 11/17/2017 °Elsevier Patient Education © 2021 [employer] ° °

## 2020-04-12 NOTE — Progress Notes (Signed)
Patient ID: Douglas Carney, male   DOB: May 12, 1950, 70 y.o.   MRN: [id number]  [name] is doing well without complaint and with clear urine.   BP 139/81 (BP Location: Left Arm)   Pulse 94   Temp 98.2 F (36.8 C)   Resp 20   Ht 6\' 2"  (1.88 m)   Wt 124.1 kg   SpO2 98%   BMI 35.14 kg/m   Voiding trial in AM.

## 2020-04-12 NOTE — Interval H&P Note (Signed)
History and Physical Interval Note:  04/12/2020 7:18 AM  Douglas Carney  has presented today for surgery, with the diagnosis of [place].  The various methods of treatment have been discussed with the patient and family. After consideration of risks, benefits and other options for treatment, the patient has consented to  Procedure(s): TRANSURETHRAL RESECTION OF THE PROSTATE (TURP) (N/A) as a surgical intervention.  The patient's history has been reviewed, patient examined, no change in status, stable for surgery.  I have reviewed the patient's chart and labs.  Questions were answered to the patient's satisfaction.     Irine Seal

## 2020-04-12 NOTE — Anesthesia Procedure Notes (Signed)
Procedure Name: LMA Insertion Date/Time: 04/12/2020 7:36 AM Performed by: Georgeanne Nim, CRNA Pre-anesthesia Checklist: Patient identified, Emergency Drugs available, Suction available, Patient being monitored and Timeout performed Patient Re-evaluated:Patient Re-evaluated prior to induction Oxygen Delivery Method: Circle system utilized Induction Type: IV induction LMA: LMA inserted LMA Size: 5.0 Number of attempts: 1 Placement Confirmation: positive ETCO2,  CO2 detector and breath sounds checked- equal and bilateral Tube secured with: Tape Dental Injury: Teeth and Oropharynx as per pre-operative assessment

## 2020-04-12 NOTE — Op Note (Signed)
Preoperative diagnosis: 1. 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 Bladder outlet obstruction secondary to BPH  Procedure:  1. Cystoscopy 2. Transurethral Resection of the prostate  Surgeon: Irine Seal. M.D.  Anesthesia: general  Complications: None  EBL: 27ml    Specimens: 1. Prostate chips  Disposition of specimens: Pathology  Indication: Douglas Carney is a patient with bladder outlet obstruction secondary to benign prostatic hyperplasia. After reviewing the management options for treatment, he elected to proceed with the above surgical procedure(s). We have discussed the potential benefits and risks of the procedure, side effects of the proposed treatment, the likelihood of the patient achieving the goals of the procedure, and any potential problems that might occur during the procedure or recuperation. Informed consent has been obtained.  Description of procedure:  The patient was taken to the operating room and general anesthesia was induced.  The patient was placed in the dorsal lithotomy position, prepped and draped in the usual sterile fashion, and preoperative antibiotics were administered. A preoperative time-out was performed.   Cystourethroscopy was performed.  The patient's urethra was examined and demonstrated  bilobar prostatic hypertrophy with a small to moderate median lobe .   The bladder was then systematically examined in its entirety. There was moderate trabeculation with some cellules and grit but no evidence of any bladder tumors, significant stones, or other mucosal pathology.  The ureteral orifices were identified so as to be avoided during the procedure.  The prostate adenoma was then resected utilizing loop cautery resection with the bipolar cutting loop.  The prostate adenoma from the middle lobe at the bladder neck back to the verumontanum was resected beginning at the six o'clock position and then extended to include  the right and left lobes of the prostate and anterior prostate. Care was taken not to resect distal to the verumontanum.  At the completion of the procedure the bladder was evacuated free of chips and hemostasis was insured.  Final inspection revealed intact ureteral orifices, a widely patent TUR channel and an intact external sphincter.   Hemostasis was then achieved with the cautery and the bladder was emptied and reinspected with no significant bleeding noted at the end of the procedure.  After removal of the scope, pressure on the bladder produced a good stream.   A 36fr 3 way catheter was then placed into the bladder and placed on continuous bladder irrigation.  The patient appeared to tolerate the procedure well and without complications.  The patient was able to be awakened and transferred to the recovery unit in satisfactory condition.

## 2020-04-12 NOTE — Anesthesia Postprocedure Evaluation (Signed)
Anesthesia Post Note  Patient: Clinical cytogeneticist  Procedure(s) Performed: TRANSURETHRAL RESECTION OF THE PROSTATE (TURP) (N/A Prostate)     Patient location during evaluation: PACU Anesthesia Type: General Level of consciousness: awake and alert and oriented Pain management: pain level controlled Vital Signs Assessment: post-procedure vital signs reviewed and stable Respiratory status: spontaneous breathing, nonlabored ventilation and respiratory function stable Cardiovascular status: blood pressure returned to baseline Postop Assessment: no apparent nausea or vomiting Anesthetic complications: no   No complications documented.  Last Vitals:  Vitals:   04/12/20 0845 04/12/20 0900  BP: 130/82 133/78  Pulse: 72 70  Resp: 18 15  Temp:    SpO2: 97% 97%    Last Pain:  Vitals:   04/12/20 0900  TempSrc:   PainSc: 0-No pain                 Brennan Bailey

## 2020-04-12 NOTE — Addendum Note (Signed)
Addendum  created 04/12/20 0935 by Georgeanne Nim, CRNA   Intraprocedure Meds edited

## 2020-04-12 NOTE — Transfer of Care (Signed)
Immediate Anesthesia Transfer of Care Note  Patient: Douglas Carney  Procedure(s) Performed: Procedure(s) (LRB): TRANSURETHRAL RESECTION OF THE PROSTATE (TURP) (N/A)  Patient Location: PACU  Anesthesia Type: General  Level of Consciousness: awake, sedated, patient cooperative and responds to stimulation  Airway & Oxygen Therapy: Patient Spontanous Breathing and Patient connected to [place] 02 and soft FM   Post-op Assessment: Report given to PACU RN, Post -op Vital signs reviewed and stable and Patient moving all extremities  Post vital signs: Reviewed and stable  Complications: No apparent anesthesia complications

## 2020-04-13 DIAGNOSIS — N401 Enlarged prostate with lower urinary tract symptoms: Secondary | ICD-10-CM | POA: Diagnosis not present

## 2020-04-13 DIAGNOSIS — N138 Other obstructive and reflux uropathy: Secondary | ICD-10-CM | POA: Diagnosis not present

## 2020-04-13 DIAGNOSIS — Z79899 Other long term (current) drug therapy: Secondary | ICD-10-CM | POA: Diagnosis not present

## 2020-04-13 DIAGNOSIS — Z7982 Long term (current) use of aspirin: Secondary | ICD-10-CM | POA: Diagnosis not present

## 2020-04-13 DIAGNOSIS — R3912 Poor urinary stream: Secondary | ICD-10-CM | POA: Diagnosis not present

## 2020-04-13 MED ORDER — ACETAMINOPHEN 325 MG PO TABS
ORAL_TABLET | ORAL | Status: AC
  Filled 2020-04-13: qty 2

## 2020-04-13 MED ORDER — OXYBUTYNIN CHLORIDE 5 MG PO TABS: 5.0000 mg | ORAL_TABLET | Freq: Three times a day (TID) | ORAL | 1 refills | Status: DC | PRN

## 2020-04-13 MED ORDER — PHENAZOPYRIDINE HCL 200 MG PO TABS: 200.0000 mg | ORAL_TABLET | Freq: Three times a day (TID) | ORAL | 0 refills | Status: DC | PRN

## 2020-04-13 NOTE — Discharge Summary (Signed)
Date of admission: 04/12/2020  Date of discharge: 04/13/2020  Admission diagnosis: BPH  Discharge diagnosis: BPH  Procedures: TURP with Dr. Jeffie Pollock  History and Physical: For full details, please see admission history and physical. Briefly, Douglas Carney is a 70 y.o. year old patient with with refractory BPH.  He is status post TURP on 04/12/2020 with Dr. Jeffie Pollock.  Hospital Course: Postoperatively, the patient was monitored on the floor.  He was voiding without difficulty and had clear to light pink urine on postop day 1.  He was discharged home without a Foley catheter  Physical Exam:  General: Alert and oriented CV: RRR, palpable distal pulses Lungs: CTAB, equal chest rise Abdomen: Soft, NTND, no rebound or guarding Ext: NT, No erythema  Laboratory values: Recent Labs    04/12/20 0647  HGB 15.0  HCT 44.0   Recent Labs    04/12/20 0647  CREATININE 0.80    Disposition: Home  Discharge instruction: The patient was instructed to be ambulatory but told to refrain from heavy lifting, strenuous activity, or driving.  Discharge medications:  Allergies as of 04/13/2020      Reactions   Contrast Media [iodinated Diagnostic Agents] Anaphylaxis      Medication List    STOP taking these medications   meloxicam 15 MG tablet Commonly known as: MOBIC     TAKE these medications   acetaminophen 500 MG tablet Commonly known as: TYLENOL Take 1,000 mg by mouth every 6 (six) hours as needed.   fluorouracil 5 % cream Commonly known as: EFUDEX Apply topically 2 (two) times daily. For sun spot areas to both ears and right temple   losartan 50 MG tablet Commonly known as: COZAAR Take 50 mg by mouth daily. Takes at 1200 pm   oxybutynin 5 MG tablet Commonly known as: DITROPAN Take 1 tablet (5 mg total) by mouth every 8 (eight) hours as needed for bladder spasms.   phenazopyridine 200 MG tablet Commonly known as: Pyridium Take 1 tablet (200 mg total) by mouth 3 (three) times daily as  needed (for pain with urination).       Followup:   Follow-up Information    Karen Kays, NP On 04/26/2020.   Specialty: Nurse Practitioner Why: [phone] Contact information: [address] [place] [address] [phone]

## 2020-04-15 ENCOUNTER — Encounter (HOSPITAL_BASED_OUTPATIENT_CLINIC_OR_DEPARTMENT_OTHER): Payer: Self-pay | Admitting: Urology

## 2020-04-15 LAB — SURGICAL PATHOLOGY

## 2020-04-26 DIAGNOSIS — R3912 Poor urinary stream: Secondary | ICD-10-CM | POA: Diagnosis not present

## 2020-04-26 DIAGNOSIS — N401 Enlarged prostate with lower urinary tract symptoms: Secondary | ICD-10-CM | POA: Diagnosis not present

## 2020-05-06 DIAGNOSIS — M25551 Pain in right hip: Secondary | ICD-10-CM | POA: Diagnosis not present

## 2020-05-06 DIAGNOSIS — M6281 Muscle weakness (generalized): Secondary | ICD-10-CM | POA: Diagnosis not present

## 2020-05-10 DIAGNOSIS — M25551 Pain in right hip: Secondary | ICD-10-CM | POA: Diagnosis not present

## 2020-05-10 DIAGNOSIS — M6281 Muscle weakness (generalized): Secondary | ICD-10-CM | POA: Diagnosis not present

## 2020-05-13 DIAGNOSIS — M6281 Muscle weakness (generalized): Secondary | ICD-10-CM | POA: Diagnosis not present

## 2020-05-13 DIAGNOSIS — M25551 Pain in right hip: Secondary | ICD-10-CM | POA: Diagnosis not present

## 2020-05-17 DIAGNOSIS — M25551 Pain in right hip: Secondary | ICD-10-CM | POA: Diagnosis not present

## 2020-05-17 DIAGNOSIS — M6281 Muscle weakness (generalized): Secondary | ICD-10-CM | POA: Diagnosis not present

## 2020-05-21 DIAGNOSIS — Z125 Encounter for screening for malignant neoplasm of prostate: Secondary | ICD-10-CM | POA: Diagnosis not present

## 2020-05-21 DIAGNOSIS — N390 Urinary tract infection, site not specified: Secondary | ICD-10-CM | POA: Diagnosis not present

## 2020-05-21 DIAGNOSIS — I10 Essential (primary) hypertension: Secondary | ICD-10-CM | POA: Diagnosis not present

## 2020-05-22 DIAGNOSIS — M6281 Muscle weakness (generalized): Secondary | ICD-10-CM | POA: Diagnosis not present

## 2020-05-22 DIAGNOSIS — M25551 Pain in right hip: Secondary | ICD-10-CM | POA: Diagnosis not present

## 2020-05-24 DIAGNOSIS — M6281 Muscle weakness (generalized): Secondary | ICD-10-CM | POA: Diagnosis not present

## 2020-05-24 DIAGNOSIS — M25551 Pain in right hip: Secondary | ICD-10-CM | POA: Diagnosis not present

## 2020-05-27 ENCOUNTER — Other Ambulatory Visit: Payer: Self-pay | Admitting: Internal Medicine

## 2020-05-27 DIAGNOSIS — M6281 Muscle weakness (generalized): Secondary | ICD-10-CM | POA: Diagnosis not present

## 2020-05-27 DIAGNOSIS — I10 Essential (primary) hypertension: Secondary | ICD-10-CM | POA: Diagnosis not present

## 2020-05-27 DIAGNOSIS — Z0001 Encounter for general adult medical examination with abnormal findings: Secondary | ICD-10-CM | POA: Diagnosis not present

## 2020-05-27 DIAGNOSIS — E785 Hyperlipidemia, unspecified: Secondary | ICD-10-CM

## 2020-05-27 DIAGNOSIS — Z91041 Radiographic dye allergy status: Secondary | ICD-10-CM | POA: Diagnosis not present

## 2020-05-27 DIAGNOSIS — M25551 Pain in right hip: Secondary | ICD-10-CM | POA: Diagnosis not present

## 2020-05-29 DIAGNOSIS — M6281 Muscle weakness (generalized): Secondary | ICD-10-CM | POA: Diagnosis not present

## 2020-05-29 DIAGNOSIS — M25551 Pain in right hip: Secondary | ICD-10-CM | POA: Diagnosis not present

## 2020-06-05 DIAGNOSIS — R300 Dysuria: Secondary | ICD-10-CM | POA: Diagnosis not present

## 2020-06-07 DIAGNOSIS — M25551 Pain in right hip: Secondary | ICD-10-CM | POA: Diagnosis not present

## 2020-06-07 DIAGNOSIS — M6281 Muscle weakness (generalized): Secondary | ICD-10-CM | POA: Diagnosis not present

## 2020-06-14 DIAGNOSIS — M25551 Pain in right hip: Secondary | ICD-10-CM | POA: Diagnosis not present

## 2020-06-14 DIAGNOSIS — M6281 Muscle weakness (generalized): Secondary | ICD-10-CM | POA: Diagnosis not present

## 2020-06-20 DIAGNOSIS — R3912 Poor urinary stream: Secondary | ICD-10-CM | POA: Diagnosis not present

## 2020-06-20 DIAGNOSIS — N3941 Urge incontinence: Secondary | ICD-10-CM | POA: Diagnosis not present

## 2020-07-15 ENCOUNTER — Ambulatory Visit
Admission: RE | Admit: 2020-07-15 | Discharge: 2020-07-15 | Disposition: A | Discharge status: Home / Self Care | Payer: BLUE CROSS/BLUE SHIELD | Source: Ambulatory Visit | Attending: Internal Medicine | Admitting: Internal Medicine

## 2020-07-15 DIAGNOSIS — E785 Hyperlipidemia, unspecified: Secondary | ICD-10-CM

## 2020-07-18 DIAGNOSIS — I10 Essential (primary) hypertension: Secondary | ICD-10-CM | POA: Diagnosis not present

## 2020-07-18 DIAGNOSIS — I712 Thoracic aortic aneurysm, without rupture: Secondary | ICD-10-CM | POA: Diagnosis not present

## 2020-07-19 DIAGNOSIS — R3121 Asymptomatic microscopic hematuria: Secondary | ICD-10-CM | POA: Diagnosis not present

## 2020-07-19 DIAGNOSIS — N401 Enlarged prostate with lower urinary tract symptoms: Secondary | ICD-10-CM | POA: Diagnosis not present

## 2020-07-19 DIAGNOSIS — R972 Elevated prostate specific antigen [PSA]: Secondary | ICD-10-CM | POA: Diagnosis not present

## 2020-07-19 DIAGNOSIS — N3941 Urge incontinence: Secondary | ICD-10-CM | POA: Diagnosis not present

## 2020-09-05 ENCOUNTER — Other Ambulatory Visit: Payer: Self-pay

## 2020-09-05 ENCOUNTER — Institutional Professional Consult (permissible substitution): Payer: Medicare HMO | Admitting: Cardiothoracic Surgery

## 2020-09-05 VITALS — BP 131/84 | HR 75 | Resp 20 | Ht 74.0 in | Wt 275.0 lb

## 2020-09-05 DIAGNOSIS — I712 Thoracic aortic aneurysm, without rupture: Secondary | ICD-10-CM

## 2020-09-05 DIAGNOSIS — I7121 Aneurysm of the ascending aorta, without rupture: Secondary | ICD-10-CM

## 2020-09-05 NOTE — Progress Notes (Signed)
[address]       [place] [address]             [phone]     CARDIOTHORACIC SURGERY CONSULTATION REPORT  Referring Provider is Deland Pretty, MD Primary Cardiologist is None PCP is Deland Pretty, MD  Chief Complaint  Patient presents with   Thoracic Aortic Aneurysm    Surgical consult    HPI:  70 year old man in reasonably good health underwent coronary CT scan for calcification this past spring at the advice of his PCP.  This demonstrated a 4.3 cm ascending aortic aneurysm discovered incidentally.  He denies any family history or personal history of aneurysm disease.  He is hypertensive and takes 1 blood pressure medicine for control.  Past Medical History:  Diagnosis Date   Arthritis    right hip   BPH (benign prostatic hyperplasia)    Cancer ([place]) 2005   melanoma removed right shoulder   COVID 1-18-2-22   fever chills body aches and cough x 5 days all symptoms resolved   Family history of adverse reaction to anesthesia    mother had brain fog after anesthesia lasted 4 to 5 dsys   Hypertension    Wears glasses     Past Surgical History:  Procedure Laterality Date   right shoulder fracture surgery  07/14/2003   right shoulder pins removed  2027   wires and screws also removed   TONSILLECTOMY  age 46   and adenoids removed   TRANSURETHRAL RESECTION OF PROSTATE N/A 04/12/2020   Procedure: TRANSURETHRAL RESECTION OF THE PROSTATE (TURP);  Surgeon: Irine Seal, MD;  Location: [facility];  Service: Urology;  Laterality: N/A;    No family history on file.  Social History   Socioeconomic History   Marital status: Single    Spouse name: Not on file   Number of children: Not on file   Years of education: Not on file   Highest education level: Not on file  Occupational History   Not on file  Tobacco Use   Smoking status: Never   Smokeless tobacco: Never  Vaping Use   Vaping Use: Never used  Substance and Sexual  Activity   Alcohol use: Yes    Comment: rare   Drug use: Not Currently   Sexual activity: Not on file  Other Topics Concern   Not on file  Social History Narrative   Not on file   Social Determinants of Health   Financial Resource Strain: Not on file  Food Insecurity: Not on file  Transportation Needs: Not on file  Physical Activity: Not on file  Stress: Not on file  Social Connections: Not on file  Intimate Partner Violence: Not on file    Current Outpatient Medications  Medication Sig Dispense Refill   acetaminophen (TYLENOL) 500 MG tablet Take 1,000 mg by mouth every 6 (six) hours as needed.     fluorouracil (EFUDEX) 5 % cream Apply topically 2 (two) times daily. For sun spot areas to both ears and right temple     losartan (COZAAR) 50 MG tablet Take 50 mg by mouth daily. Takes at 1200 pm     oxybutynin (DITROPAN) 5 MG tablet Take 1 tablet (5 mg total) by mouth every 8 (eight) hours as needed for bladder spasms. 30 tablet 1   phenazopyridine (PYRIDIUM) 200 MG tablet Take 1 tablet (200 mg total) by mouth 3 (three) times daily as needed (for pain with urination).  30 tablet 0   No current facility-administered medications for this visit.    Allergies  Allergen Reactions   Contrast Media [Iodinated Diagnostic Agents] Anaphylaxis      Review of Systems:   General:  Modest weight gain during COVID  Cardiac:  No chest pain or shortness of breath  Respiratory:  Negative for shortness of breath or cough  GI:   No abdominal pain or bleeding  GU:   No prostate or kidney disease  Vascular:  No claudication or venous disease  Neuro:   Denies strokes or TIAs  Musculoskeletal: Endorses arthritis  Skin:   History of rashes  Psych:   Negative anxiety or depression  Eyes:   Wears glasses  ENT:   Negative  Hematologic:  Negative  Endocrine:  No diabetes or thyroid disease     Physical Exam:   BP 131/84   Pulse 75   Resp 20   Ht 6\' 2"  (1.88 m)   Wt 124.7 kg   SpO2 96%  Comment: RA  BMI 35.31 kg/m   General:    well-appearing  HEENT:  Unremarkable   Neck:   no JVD, no bruits, no adenopathy   Chest:   clear to auscultation, symmetrical breath sounds, no wheezes, no rhonchi   CV:   RRR, no detectable murmur   Abdomen:  soft, non-tender, no masses   Extremities:  warm, well-perfused, pulses intact throughout, no LE edema  Rectal/GU  Deferred  Neuro:   Grossly non-focal and symmetrical throughout  Skin:   Clean and dry, no rashes, no breakdown   Diagnostic Tests:  I personally reviewed his available imaging studies including chest CT from 07/15/2020 which is a 4.3 cm ascending aortic aneurysm   Impression:  70 year old man without significant history for aneurysm disease presents with incidentally discovered 4.3 cm ascending aortic aneurysm posing very little risk for rupture or other emergent condition   Plan:  Follow-up in 1 year with repeat CT scan Continue to observe blood pressure control measures   I spent in excess of 30 minutes during the conduct of this office consultation and >50% of this time involved direct face-to-face encounter with the patient for counseling and/or coordination of their care.          Level 3 Office Consult = 40 minutes         Level 4 Office Consult = 60 minutes         Level 5 Office Consult = 80 minutes  B.  Murvin Natal, MD 09/05/2020 3:21 PM

## 2020-09-12 DIAGNOSIS — I10 Essential (primary) hypertension: Secondary | ICD-10-CM | POA: Diagnosis not present

## 2020-09-12 DIAGNOSIS — I712 Thoracic aortic aneurysm, without rupture: Secondary | ICD-10-CM | POA: Diagnosis not present

## 2020-09-26 DIAGNOSIS — U071 COVID-19: Secondary | ICD-10-CM | POA: Diagnosis not present

## 2020-12-16 DIAGNOSIS — H5213 Myopia, bilateral: Secondary | ICD-10-CM | POA: Diagnosis not present

## 2020-12-16 DIAGNOSIS — H524 Presbyopia: Secondary | ICD-10-CM | POA: Diagnosis not present

## 2020-12-16 DIAGNOSIS — H25813 Combined forms of age-related cataract, bilateral: Secondary | ICD-10-CM | POA: Diagnosis not present

## 2020-12-18 DIAGNOSIS — Z85820 Personal history of malignant melanoma of skin: Secondary | ICD-10-CM | POA: Diagnosis not present

## 2020-12-18 DIAGNOSIS — D225 Melanocytic nevi of trunk: Secondary | ICD-10-CM | POA: Diagnosis not present

## 2020-12-18 DIAGNOSIS — L570 Actinic keratosis: Secondary | ICD-10-CM | POA: Diagnosis not present

## 2020-12-18 DIAGNOSIS — D1801 Hemangioma of skin and subcutaneous tissue: Secondary | ICD-10-CM | POA: Diagnosis not present

## 2020-12-18 DIAGNOSIS — L821 Other seborrheic keratosis: Secondary | ICD-10-CM | POA: Diagnosis not present

## 2020-12-21 ENCOUNTER — Emergency Department (HOSPITAL_BASED_OUTPATIENT_CLINIC_OR_DEPARTMENT_OTHER)
Admission: EM | Admit: 2020-12-21 | Discharge: 2020-12-21 | Disposition: A | Discharge status: Home / Self Care | Payer: Medicare HMO | Attending: Emergency Medicine | Admitting: Emergency Medicine

## 2020-12-21 ENCOUNTER — Emergency Department (HOSPITAL_BASED_OUTPATIENT_CLINIC_OR_DEPARTMENT_OTHER): Payer: Medicare HMO | Admitting: Radiology

## 2020-12-21 ENCOUNTER — Other Ambulatory Visit: Payer: Self-pay

## 2020-12-21 ENCOUNTER — Encounter (HOSPITAL_BASED_OUTPATIENT_CLINIC_OR_DEPARTMENT_OTHER): Payer: Self-pay

## 2020-12-21 DIAGNOSIS — R55 Syncope and collapse: Secondary | ICD-10-CM | POA: Diagnosis not present

## 2020-12-21 DIAGNOSIS — R0602 Shortness of breath: Secondary | ICD-10-CM | POA: Diagnosis not present

## 2020-12-21 DIAGNOSIS — R059 Cough, unspecified: Secondary | ICD-10-CM | POA: Diagnosis not present

## 2020-12-21 DIAGNOSIS — R079 Chest pain, unspecified: Secondary | ICD-10-CM

## 2020-12-21 DIAGNOSIS — R110 Nausea: Secondary | ICD-10-CM | POA: Diagnosis not present

## 2020-12-21 DIAGNOSIS — R42 Dizziness and giddiness: Secondary | ICD-10-CM | POA: Diagnosis not present

## 2020-12-21 DIAGNOSIS — R231 Pallor: Secondary | ICD-10-CM | POA: Diagnosis not present

## 2020-12-21 DIAGNOSIS — R0902 Hypoxemia: Secondary | ICD-10-CM | POA: Diagnosis not present

## 2020-12-21 LAB — CBC WITH DIFFERENTIAL/PLATELET
Abs Immature Granulocytes: 0.03 10*3/uL (ref 0.00–0.07)
Basophils Absolute: 0.1 10*3/uL (ref 0.0–0.1)
Basophils Relative: 1 %
Eosinophils Absolute: 0 10*3/uL (ref 0.0–0.5)
Eosinophils Relative: 0 %
HCT: 45.9 % (ref 39.0–52.0)
Hemoglobin: 16.1 g/dL (ref 13.0–17.0)
Immature Granulocytes: 0 %
Lymphocytes Relative: 14 %
Lymphs Abs: 1.6 10*3/uL (ref 0.7–4.0)
MCH: 32.3 pg (ref 26.0–34.0)
MCHC: 35.1 g/dL (ref 30.0–36.0)
MCV: 92.2 fL (ref 80.0–100.0)
Monocytes Absolute: 1 10*3/uL (ref 0.1–1.0)
Monocytes Relative: 8 %
Neutro Abs: 9 10*3/uL — ABNORMAL HIGH (ref 1.7–7.7)
Neutrophils Relative %: 77 %
Platelets: 146 10*3/uL — ABNORMAL LOW (ref 150–400)
RBC: 4.98 MIL/uL (ref 4.22–5.81)
RDW: 12.8 % (ref 11.5–15.5)
WBC: 11.7 10*3/uL — ABNORMAL HIGH (ref 4.0–10.5)
nRBC: 0 % (ref 0.0–0.2)

## 2020-12-21 LAB — COMPREHENSIVE METABOLIC PANEL
ALT: 26 U/L (ref 0–44)
AST: 17 U/L (ref 15–41)
Albumin: 4.6 g/dL (ref 3.5–5.0)
Alkaline Phosphatase: 71 U/L (ref 38–126)
Anion gap: 12 (ref 5–15)
BUN: 16 mg/dL (ref 8–23)
CO2: 23 mmol/L (ref 22–32)
Calcium: 9.5 mg/dL (ref 8.9–10.3)
Chloride: 102 mmol/L (ref 98–111)
Creatinine, Ser: 1.04 mg/dL (ref 0.61–1.24)
GFR, Estimated: >60 mL/min (ref >60)
Glucose, Bld: 89 mg/dL (ref 70–99)
Potassium: 3.8 mmol/L (ref 3.5–5.1)
Sodium: 137 mmol/L (ref 135–145)
Total Bilirubin: 0.6 mg/dL (ref 0.3–1.2)
Total Protein: 6.7 g/dL (ref 6.5–8.1)

## 2020-12-21 NOTE — ED Triage Notes (Signed)
Pt was getting a pedicure when he had a syncopal episode in the chair. Denies pain. C/o ongoing lightheadedness

## 2020-12-21 NOTE — Discharge Instructions (Addendum)
You were seen and evaluated in the Emergency Department today for for evaluation of syncope.  As we discussed, your lab work and imaging are otherwise normal.  I have a low suspicion that this is coming from your heart.  Please follow-up with your primary care provider within the next week for further evaluation.  Please return to the Emergency Department if you experience another episode of passing out, chest pain, shortness of breath, trouble talking, trouble walking, weakness or numbness in upper extremity or lower extremity, or any other concerns you might have.

## 2020-12-21 NOTE — ED Notes (Signed)
Patient is resting comfortably, warm blanket given.

## 2020-12-21 NOTE — ED Provider Notes (Signed)
[facility] EMERGENCY DEPT Provider Note   CSN: [id number] Arrival date & time: 12/21/20  1712     History Chief Complaint  Patient presents with   Loss of Consciousness    Douglas Carney is a 70 y.o. male who presents the emergency department after a syncopal episode that occurred roughly an hour ago.  He states that he received his COVID and influenza boosters on Thursday and was feeling little under the weather yesterday.  Had some trouble sleeping last night and was up until about 5 AM this morning.  He states that he was getting a pedicure when he felt flushed, diaphoretic, and clammy when he lost consciousness.  He reports associated blurred vision and nausea prior to the episode.  He denies any chest pain or shortness of breath prior to this episode.  He denies any recent illness including fever, chills, cough, congestion, abdominal pain, vomiting.  Did have some diarrhea after the syncopal episode.  He states he is feeling back to his baseline currently.  The history is provided by the patient. No language interpreter was used.  Loss of Consciousness     Past Medical History:  Diagnosis Date   Arthritis    right hip   BPH (benign prostatic hyperplasia)    Cancer ([facility]) 2005   melanoma removed right shoulder   COVID 1-18-2-22   fever chills body aches and cough x 5 days all symptoms resolved   Family history of adverse reaction to anesthesia    mother had brain fog after anesthesia lasted 4 to 5 dsys   Hypertension    Wears glasses     Patient Active Problem List   Diagnosis Date Noted   BPH with obstruction/lower urinary tract symptoms 04/12/2020    Past Surgical History:  Procedure Laterality Date   right shoulder fracture surgery  07/14/2003   right shoulder pins removed  2027   wires and screws also removed   TONSILLECTOMY  age 77   and adenoids removed   TRANSURETHRAL RESECTION OF PROSTATE N/A 04/12/2020   Procedure: TRANSURETHRAL RESECTION OF  THE PROSTATE (TURP);  Surgeon: Irine Seal, MD;  Location: [facility];  Service: Urology;  Laterality: N/A;       No family history on file.  Social History   Tobacco Use   Smoking status: Never   Smokeless tobacco: Never  Vaping Use   Vaping Use: Never used  Substance Use Topics   Alcohol use: Yes    Comment: rare   Drug use: Not Currently    Home Medications Prior to Admission medications   Medication Sig Start Date End Date Taking? Authorizing Provider  acetaminophen (TYLENOL) 500 MG tablet Take 1,000 mg by mouth every 6 (six) hours as needed.    [provider]  fluorouracil (EFUDEX) 5 % cream Apply topically 2 (two) times daily. For sun spot areas to both ears and right temple    [provider]  losartan (COZAAR) 50 MG tablet Take 50 mg by mouth daily. Takes at 1200 pm    [provider]  oxybutynin (DITROPAN) 5 MG tablet Take 1 tablet (5 mg total) by mouth every 8 (eight) hours as needed for bladder spasms. 04/13/20   Ceasar Mons, MD  phenazopyridine (PYRIDIUM) 200 MG tablet Take 1 tablet (200 mg total) by mouth 3 (three) times daily as needed (for pain with urination). 04/13/20 04/13/21  Ceasar Mons, MD    Allergies    Contrast media [iodinated diagnostic  agents]  Review of Systems   Review of Systems  Cardiovascular:  Positive for syncope.  All other systems reviewed and are negative.  Physical Exam Updated Vital Signs BP 130/75   Pulse 68   Temp 98 F (36.7 C) (Oral)   Resp 16   SpO2 100%   Physical Exam Vitals and nursing note reviewed.  Constitutional:      General: He is not in acute distress.    Appearance: Normal appearance.  HENT:     Head: Normocephalic and atraumatic.  Eyes:     General:        Right eye: No discharge.        Left eye: No discharge.  Cardiovascular:     Comments: Regular rate and rhythm.  S1/S2 are distinct without any evidence of murmur, rubs, or gallops.   Radial pulses are 2+ bilaterally.  Dorsalis pedis pulses are 2+ bilaterally.  No evidence of pedal edema. Pulmonary:     Comments: Clear to auscultation bilaterally.  Normal effort.  No respiratory distress.  No evidence of wheezes, rales, or rhonchi heard throughout. Abdominal:     General: Abdomen is flat. Bowel sounds are normal. There is no distension.     Tenderness: There is no abdominal tenderness. There is no guarding or rebound.  Musculoskeletal:        General: Normal range of motion.     Cervical back: Neck supple.  Skin:    General: Skin is warm and dry.     Findings: No rash.  Neurological:     General: No focal deficit present.     Mental Status: He is alert.  Psychiatric:        Mood and Affect: Mood normal.        Behavior: Behavior normal.    ED Results / Procedures / Treatments   Labs (all labs ordered are listed, but only abnormal results are displayed) Labs Reviewed  CBC WITH DIFFERENTIAL/PLATELET - Abnormal; Notable for the following components:      Result Value   WBC 11.7 (*)    Platelets 146 (*)    Neutro Abs 9.0 (*)    All other components within normal limits  COMPREHENSIVE METABOLIC PANEL    EKG EKG Interpretation  Date/Time:  Saturday December 21 2020 17:25:41 EDT Ventricular Rate:  59 PR Interval:  172 QRS Duration: 119 QT Interval:  426 QTC Calculation: 422 R Axis:   -45 Text Interpretation: Sinus rhythm Incomplete RBBB and LAFB No significant change since last tracing Confirmed by Calvert Cantor [phone]) on 12/21/2020 6:09:40 PM  Radiology DG Chest Port 1 View  Result Date: 12/21/2020 CLINICAL DATA:  Chest pain/shortness of breath. EXAM: PORTABLE CHEST 1 VIEW COMPARISON:  Cardiac CT dated 07/15/2020. FINDINGS: The heart size and mediastinal contours are within normal limits. There is mild left basilar atelectasis/airspace disease. The right lung is clear. There is no pleural effusion or pneumothorax. Degenerative changes are seen in the  spine. IMPRESSION: Mild left basilar atelectasis/airspace disease. Electronically Signed   By: Zerita Boers M.D.   On: 12/21/2020 19:10    Procedures Procedures   Medications Ordered in ED Medications - No data to display  ED Course  I have reviewed the triage vital signs and the nursing notes.  Pertinent labs & imaging results that were available during my care of the patient were reviewed by me and considered in my medical decision making (see chart for details).  Clinical Course as of 12/21/20 2019  Sat  Dec 21, 2020  1829 I discussed this case with my attending physician who cosigned this note including patient's presenting symptoms, physical exam, and planned diagnostics and interventions. Attending physician stated agreement with plan or made changes to plan which were implemented.   Attending physician assessed patient at bedside.   [CF]    Clinical Course User Index [CF] Cherrie Gauze   MDM Rules/Calculators/A&P                          Douglas Carney is a 70 y.o. male who presents emergency department after syncopal episode.  Given the history, exam, and work-up I would low suspicion for cardiac origin of his syncope.  Was not to include heart failure, ACS, or arrhythmia.  He has no cardiac risk factors apart from age and hypertension.  I also low suspicion for pulmonary embolism this is vital signs of been completely normal and he has had no chest pain or shortness of breath or other risk factors.    CBC and and CMP are normal.  Chest x-ray is negative apart from some atelectasis in the left lower base.  Patient is feeling much better.  He has been stable during his visit without any overt arrhythmias or abnormal vital signs.  Based on [place] syncope risk score he is safe for discharge.  I will have him follow-up with his primary care doctor early next week to ensure we are going the right direction.   Final Clinical Impression(s) / ED Diagnoses Final diagnoses:   Syncope, unspecified syncope type    Rx / DC Orders ED Discharge Orders     None        Cherrie Gauze 12/21/20 2019    Truddie Hidden, MD 12/21/20 [phone]

## 2020-12-21 NOTE — ED Triage Notes (Signed)
He states that he had a syncope while receiving a pedicure. He tells [place] he drank "less than a glass" of wine while he was there. Upon arriving on our unit he asked to go to the restroom, at which time he had "a lot of diarrhea". He tells me after having had the b.m. here, he feels "much better". He denies falling, any injuries nor any pain or discomfort at this time. He is alert and oriented x 4 with clear speech.

## 2020-12-24 DIAGNOSIS — R55 Syncope and collapse: Secondary | ICD-10-CM | POA: Diagnosis not present

## 2021-01-13 DIAGNOSIS — R972 Elevated prostate specific antigen [PSA]: Secondary | ICD-10-CM | POA: Diagnosis not present

## 2021-01-14 DIAGNOSIS — R55 Syncope and collapse: Secondary | ICD-10-CM | POA: Diagnosis not present

## 2021-01-16 DIAGNOSIS — R55 Syncope and collapse: Secondary | ICD-10-CM | POA: Diagnosis not present

## 2021-01-17 ENCOUNTER — Telehealth: Payer: Self-pay

## 2021-01-17 NOTE — Telephone Encounter (Signed)
NOTES SCANNED TO REFERRAL 

## 2021-01-20 DIAGNOSIS — R3121 Asymptomatic microscopic hematuria: Secondary | ICD-10-CM | POA: Diagnosis not present

## 2021-01-20 DIAGNOSIS — N401 Enlarged prostate with lower urinary tract symptoms: Secondary | ICD-10-CM | POA: Diagnosis not present

## 2021-01-20 DIAGNOSIS — R351 Nocturia: Secondary | ICD-10-CM | POA: Diagnosis not present

## 2021-02-28 DIAGNOSIS — N529 Male erectile dysfunction, unspecified: Secondary | ICD-10-CM | POA: Diagnosis not present

## 2021-02-28 DIAGNOSIS — N200 Calculus of kidney: Secondary | ICD-10-CM | POA: Diagnosis not present

## 2021-02-28 DIAGNOSIS — I10 Essential (primary) hypertension: Secondary | ICD-10-CM | POA: Diagnosis not present

## 2021-02-28 DIAGNOSIS — E785 Hyperlipidemia, unspecified: Secondary | ICD-10-CM | POA: Diagnosis not present

## 2021-03-09 NOTE — Progress Notes (Signed)
Chief Complaint  Patient presents with   New Patient (Initial Visit)    SVT   History of Present Illness: 71 yo male with history of BPH, HTN, thoracic aortic aneurym and SVT who is here today as a new patient for the evaluation of SVT. He was seen in the [facility] ED 12/21/20 after a syncopal episode that occurred one day after his Covid 19 booster. He wore a cardiac monitor for 14 days in October/November 2022 and this showed several short runs of SVT. CT coronary calcium score of zero in May 2022. 4.1 cm ascending aortic aneurysm noted on CT scan. I also take care of his partner Ashley Royalty.   He tells me today that he is not aware of any palpitations. No chest pain or dyspnea. No LE edema.   Primary Care Physician: Deland Pretty, MD   Past Medical History:  Diagnosis Date   Arthritis    right hip   BPH (benign prostatic hyperplasia)    Cancer ([place]) 2005   melanoma removed right shoulder   COVID 1-18-2-22   fever chills body aches and cough x 5 days all symptoms resolved   Family history of adverse reaction to anesthesia    mother had brain fog after anesthesia lasted 4 to 5 dsys   Hypertension    Syncope    Wears glasses     Past Surgical History:  Procedure Laterality Date   APPENDECTOMY     right shoulder fracture surgery  07/14/2003   right shoulder pins removed  2027   wires and screws also removed   TONSILLECTOMY  age 13   and adenoids removed   TRANSURETHRAL RESECTION OF PROSTATE N/A 04/12/2020   Procedure: TRANSURETHRAL RESECTION OF THE PROSTATE (TURP);  Surgeon: Irine Seal, MD;  Location: [facility];  Service: Urology;  Laterality: N/A;    Current Outpatient Medications  Medication Sig Dispense Refill   acetaminophen (TYLENOL) 500 MG tablet Take 1,000 mg by mouth every 6 (six) hours as needed.     fluorouracil (EFUDEX) 5 % cream Apply topically 2 (two) times daily. For sun spot areas to both ears and right temple     losartan (COZAAR) 100 MG tablet  Take 100 mg by mouth daily. Takes at 1200 pm     sildenafil (REVATIO) 20 MG tablet Take 20 mg by mouth as needed.     No current facility-administered medications for this visit.    Allergies  Allergen Reactions   Contrast Media [Iodinated Contrast Media] Anaphylaxis    Social History   Socioeconomic History   Marital status: Single    Spouse name: Not on file   Number of children: Not on file   Years of education: Not on file   Highest education level: Not on file  Occupational History   Occupation: Textiles  Tobacco Use   Smoking status: Never   Smokeless tobacco: Never  Vaping Use   Vaping Use: Never used  Substance and Sexual Activity   Alcohol use: Yes    Comment: rare   Drug use: Not Currently   Sexual activity: Not on file  Other Topics Concern   Not on file  Social History Narrative   Not on file   Social Determinants of Health   Financial Resource Strain: Not on file  Food Insecurity: Not on file  Transportation Needs: Not on file  Physical Activity: Not on file  Stress: Not on file  Social Connections: Not on file  Intimate Partner  Violence: Not on file    Family History  Problem Relation Age of Onset   Lymphoma Mother    Cancer Father 46       Melanoma    Review of Systems:  As stated in the HPI and otherwise negative.   BP 122/86    Pulse (!) 56    Ht 6\' 2"  (1.88 m)    Wt 279 lb 3.2 oz (126.6 kg)    SpO2 98%    BMI 35.85 kg/m   Physical Examination: General: Well developed, well nourished, NAD  HEENT: OP clear, mucus membranes moist  SKIN: warm, dry. No rashes. Neuro: No focal deficits  Musculoskeletal: Muscle strength 5/5 all ext  Psychiatric: Mood and affect normal  Neck: No JVD, no carotid bruits, no thyromegaly, no lymphadenopathy.  Lungs:Clear bilaterally, no wheezes, rhonci, crackles Cardiovascular: Regular rate and rhythm. No murmurs, gallops or rubs. Abdomen:Soft. Bowel sounds present. Non-tender.  Extremities: No lower  extremity edema. Pulses are 2 + in the bilateral DP/PT.  EKG:  EKG is ordered today. The ekg ordered today demonstrates sinus bradycardia  Recent Labs: 12/21/2020: ALT 26; BUN 16; Creatinine, Ser 1.04; Hemoglobin 16.1; Platelets 146; Potassium 3.8; Sodium 137   Lipid Panel No results found for: CHOL, TRIG, HDL, CHOLHDL, VLDL, LDLCALC, LDLDIRECT   Wt Readings from Last 3 Encounters:  03/10/21 279 lb 3.2 oz (126.6 kg)  09/05/20 275 lb (124.7 kg)  04/12/20 273 lb 11.2 oz (124.1 kg)     Assessment and Plan:   1. SVT: He has no palpitations. SVT noted on recent cardiac monitor. He was not aware of these episodes when they occurred. I have gone over the etiology of SVT and explained to him that no alteration in his medical therapy is indicated. Will arrange an echo to assess LV systolic function and exclude structural heart disease.   2. Thoracic aortic aneurysm: Followed in CT surgery office. Repeat CTA pending in May 2023.   Current medicines are reviewed at length with the patient today.  The patient does not have concerns regarding medicines.  The following changes have been made:  no change  Labs/ tests ordered today include:   Orders Placed This Encounter  Procedures   EKG 12-Lead   ECHOCARDIOGRAM COMPLETE    Disposition:   F/U with me in one year.    Signed, Lauree Chandler, MD 03/10/2021 10:54 AM    [facility] [place], [place], [place]  [address] Phone: [phone]; Fax: [phone]

## 2021-03-10 ENCOUNTER — Encounter: Payer: Self-pay | Admitting: Cardiovascular Disease

## 2021-03-10 ENCOUNTER — Other Ambulatory Visit: Payer: Self-pay

## 2021-03-10 ENCOUNTER — Ambulatory Visit (INDEPENDENT_AMBULATORY_CARE_PROVIDER_SITE_OTHER): Payer: Medicare HMO | Admitting: Cardiovascular Disease

## 2021-03-10 VITALS — BP 122/86 | HR 56 | Ht 74.0 in | Wt 279.2 lb

## 2021-03-10 DIAGNOSIS — I471 Supraventricular tachycardia: Secondary | ICD-10-CM | POA: Diagnosis not present

## 2021-03-10 NOTE — Patient Instructions (Signed)
Medication Instructions:  No changes *If you need a refill on your cardiac medications before your next appointment, please call your pharmacy*   Lab Work: none   Testing/Procedures: Your physician has requested that you have an echocardiogram. Echocardiography is a painless test that uses sound waves to create images of your heart. It provides your doctor with information about the size and shape of your heart and how well your hearts chambers and valves are working. This procedure takes approximately one hour. There are no restrictions for this procedure.   Follow-Up: At [facility], you and your health needs are our priority.  As part of our continuing mission to provide you with exceptional heart care, we have created designated Provider Care Teams.  These Care Teams include your primary Cardiologist (physician) and Advanced Practice Providers (APPs -  Physician Assistants and Nurse Practitioners) who all work together to provide you with the care you need, when you need it.   Your next appointment:   12 month(s)  The format for your next appointment:   In Person  Provider:   Lauree Chandler, MD     Other Instructions

## 2021-03-25 ENCOUNTER — Other Ambulatory Visit: Payer: Self-pay

## 2021-03-25 ENCOUNTER — Ambulatory Visit (HOSPITAL_COMMUNITY): Payer: Medicare HMO | Attending: Cardiology

## 2021-03-25 DIAGNOSIS — I471 Supraventricular tachycardia: Secondary | ICD-10-CM | POA: Diagnosis not present

## 2021-03-25 LAB — ECHOCARDIOGRAM COMPLETE
AR max vel: 4.16 cm2
AV Area VTI: 4.5 cm2
AV Area mean vel: 4.07 cm2
AV Mean grad: 3 mmHg
AV Peak grad: 6.3 mmHg
Ao pk vel: 1.25 m/s
Area-P 1/2: 2.62 cm2
S' Lateral: 3.35 cm

## 2021-04-14 DIAGNOSIS — M6281 Muscle weakness (generalized): Secondary | ICD-10-CM | POA: Diagnosis not present

## 2021-04-14 DIAGNOSIS — M25552 Pain in left hip: Secondary | ICD-10-CM | POA: Diagnosis not present

## 2021-04-14 DIAGNOSIS — R262 Difficulty in walking, not elsewhere classified: Secondary | ICD-10-CM | POA: Diagnosis not present

## 2021-04-16 DIAGNOSIS — M6281 Muscle weakness (generalized): Secondary | ICD-10-CM | POA: Diagnosis not present

## 2021-04-16 DIAGNOSIS — M25552 Pain in left hip: Secondary | ICD-10-CM | POA: Diagnosis not present

## 2021-04-16 DIAGNOSIS — R262 Difficulty in walking, not elsewhere classified: Secondary | ICD-10-CM | POA: Diagnosis not present

## 2021-04-23 DIAGNOSIS — M6281 Muscle weakness (generalized): Secondary | ICD-10-CM | POA: Diagnosis not present

## 2021-04-23 DIAGNOSIS — M25552 Pain in left hip: Secondary | ICD-10-CM | POA: Diagnosis not present

## 2021-04-23 DIAGNOSIS — R262 Difficulty in walking, not elsewhere classified: Secondary | ICD-10-CM | POA: Diagnosis not present

## 2021-04-25 DIAGNOSIS — M25552 Pain in left hip: Secondary | ICD-10-CM | POA: Diagnosis not present

## 2021-04-25 DIAGNOSIS — M6281 Muscle weakness (generalized): Secondary | ICD-10-CM | POA: Diagnosis not present

## 2021-04-25 DIAGNOSIS — R262 Difficulty in walking, not elsewhere classified: Secondary | ICD-10-CM | POA: Diagnosis not present

## 2021-04-28 DIAGNOSIS — R262 Difficulty in walking, not elsewhere classified: Secondary | ICD-10-CM | POA: Diagnosis not present

## 2021-04-28 DIAGNOSIS — M25552 Pain in left hip: Secondary | ICD-10-CM | POA: Diagnosis not present

## 2021-04-28 DIAGNOSIS — M6281 Muscle weakness (generalized): Secondary | ICD-10-CM | POA: Diagnosis not present

## 2021-05-02 DIAGNOSIS — R262 Difficulty in walking, not elsewhere classified: Secondary | ICD-10-CM | POA: Diagnosis not present

## 2021-05-02 DIAGNOSIS — M6281 Muscle weakness (generalized): Secondary | ICD-10-CM | POA: Diagnosis not present

## 2021-05-02 DIAGNOSIS — M25552 Pain in left hip: Secondary | ICD-10-CM | POA: Diagnosis not present

## 2021-05-05 DIAGNOSIS — R262 Difficulty in walking, not elsewhere classified: Secondary | ICD-10-CM | POA: Diagnosis not present

## 2021-05-05 DIAGNOSIS — M25552 Pain in left hip: Secondary | ICD-10-CM | POA: Diagnosis not present

## 2021-05-05 DIAGNOSIS — M6281 Muscle weakness (generalized): Secondary | ICD-10-CM | POA: Diagnosis not present

## 2021-05-12 DIAGNOSIS — R262 Difficulty in walking, not elsewhere classified: Secondary | ICD-10-CM | POA: Diagnosis not present

## 2021-05-12 DIAGNOSIS — M6281 Muscle weakness (generalized): Secondary | ICD-10-CM | POA: Diagnosis not present

## 2021-05-12 DIAGNOSIS — M25552 Pain in left hip: Secondary | ICD-10-CM | POA: Diagnosis not present

## 2021-05-16 DIAGNOSIS — M6281 Muscle weakness (generalized): Secondary | ICD-10-CM | POA: Diagnosis not present

## 2021-05-16 DIAGNOSIS — M25552 Pain in left hip: Secondary | ICD-10-CM | POA: Diagnosis not present

## 2021-05-16 DIAGNOSIS — R262 Difficulty in walking, not elsewhere classified: Secondary | ICD-10-CM | POA: Diagnosis not present

## 2021-05-19 DIAGNOSIS — M6281 Muscle weakness (generalized): Secondary | ICD-10-CM | POA: Diagnosis not present

## 2021-05-19 DIAGNOSIS — R262 Difficulty in walking, not elsewhere classified: Secondary | ICD-10-CM | POA: Diagnosis not present

## 2021-05-19 DIAGNOSIS — M25552 Pain in left hip: Secondary | ICD-10-CM | POA: Diagnosis not present

## 2021-05-23 DIAGNOSIS — R262 Difficulty in walking, not elsewhere classified: Secondary | ICD-10-CM | POA: Diagnosis not present

## 2021-05-23 DIAGNOSIS — M6281 Muscle weakness (generalized): Secondary | ICD-10-CM | POA: Diagnosis not present

## 2021-05-23 DIAGNOSIS — M25552 Pain in left hip: Secondary | ICD-10-CM | POA: Diagnosis not present

## 2021-05-26 DIAGNOSIS — M25552 Pain in left hip: Secondary | ICD-10-CM | POA: Diagnosis not present

## 2021-05-26 DIAGNOSIS — R262 Difficulty in walking, not elsewhere classified: Secondary | ICD-10-CM | POA: Diagnosis not present

## 2021-05-26 DIAGNOSIS — M6281 Muscle weakness (generalized): Secondary | ICD-10-CM | POA: Diagnosis not present

## 2021-05-29 DIAGNOSIS — Z125 Encounter for screening for malignant neoplasm of prostate: Secondary | ICD-10-CM | POA: Diagnosis not present

## 2021-05-29 DIAGNOSIS — E785 Hyperlipidemia, unspecified: Secondary | ICD-10-CM | POA: Diagnosis not present

## 2021-05-29 DIAGNOSIS — I10 Essential (primary) hypertension: Secondary | ICD-10-CM | POA: Diagnosis not present

## 2021-05-30 DIAGNOSIS — R262 Difficulty in walking, not elsewhere classified: Secondary | ICD-10-CM | POA: Diagnosis not present

## 2021-05-30 DIAGNOSIS — M25552 Pain in left hip: Secondary | ICD-10-CM | POA: Diagnosis not present

## 2021-05-30 DIAGNOSIS — M6281 Muscle weakness (generalized): Secondary | ICD-10-CM | POA: Diagnosis not present

## 2021-06-02 DIAGNOSIS — M6281 Muscle weakness (generalized): Secondary | ICD-10-CM | POA: Diagnosis not present

## 2021-06-02 DIAGNOSIS — R262 Difficulty in walking, not elsewhere classified: Secondary | ICD-10-CM | POA: Diagnosis not present

## 2021-06-02 DIAGNOSIS — Z6835 Body mass index (BMI) 35.0-35.9, adult: Secondary | ICD-10-CM | POA: Diagnosis not present

## 2021-06-02 DIAGNOSIS — I7121 Aneurysm of the ascending aorta, without rupture: Secondary | ICD-10-CM | POA: Diagnosis not present

## 2021-06-02 DIAGNOSIS — N529 Male erectile dysfunction, unspecified: Secondary | ICD-10-CM | POA: Diagnosis not present

## 2021-06-02 DIAGNOSIS — Z0000 Encounter for general adult medical examination without abnormal findings: Secondary | ICD-10-CM | POA: Diagnosis not present

## 2021-06-02 DIAGNOSIS — I10 Essential (primary) hypertension: Secondary | ICD-10-CM | POA: Diagnosis not present

## 2021-06-02 DIAGNOSIS — M25552 Pain in left hip: Secondary | ICD-10-CM | POA: Diagnosis not present

## 2021-06-06 DIAGNOSIS — M6281 Muscle weakness (generalized): Secondary | ICD-10-CM | POA: Diagnosis not present

## 2021-06-06 DIAGNOSIS — M25552 Pain in left hip: Secondary | ICD-10-CM | POA: Diagnosis not present

## 2021-06-06 DIAGNOSIS — R262 Difficulty in walking, not elsewhere classified: Secondary | ICD-10-CM | POA: Diagnosis not present

## 2021-06-09 DIAGNOSIS — M6281 Muscle weakness (generalized): Secondary | ICD-10-CM | POA: Diagnosis not present

## 2021-06-09 DIAGNOSIS — R262 Difficulty in walking, not elsewhere classified: Secondary | ICD-10-CM | POA: Diagnosis not present

## 2021-06-09 DIAGNOSIS — M25552 Pain in left hip: Secondary | ICD-10-CM | POA: Diagnosis not present

## 2021-07-23 DIAGNOSIS — N401 Enlarged prostate with lower urinary tract symptoms: Secondary | ICD-10-CM | POA: Diagnosis not present

## 2021-07-23 DIAGNOSIS — R972 Elevated prostate specific antigen [PSA]: Secondary | ICD-10-CM | POA: Diagnosis not present

## 2021-07-23 DIAGNOSIS — R351 Nocturia: Secondary | ICD-10-CM | POA: Diagnosis not present

## 2021-08-04 ENCOUNTER — Other Ambulatory Visit: Payer: Self-pay | Admitting: *Deleted

## 2021-08-04 DIAGNOSIS — I7121 Aneurysm of the ascending aorta, without rupture: Secondary | ICD-10-CM

## 2021-08-05 IMAGING — CT CT CARDIAC CORONARY ARTERY CALCIUM SCORE
3 series · 14 of 20 positions shown, 16 images · non-contrast
Comparison: None.

CLINICAL DATA: Dyslipidemia

EXAM:
CT CARDIAC CORONARY ARTERY CALCIUM SCORE
TECHNIQUE: Non-contrast imaging through the heart was performed using
prospective ECG gating. Image post processing was performed on an
independent workstation, allowing for quantitative analysis of the
heart and coronary arteries. Note that this exam targets the heart
and the chest was not imaged in its entirety.

[Series 2: calcium scoring 2.00 qr36 bestdiast 69% hrt calciu · axial · 0.46mm/px · z∈[+1731,+1803]mm · 4 of 61 slices shown]
[im 13/61  vessel]
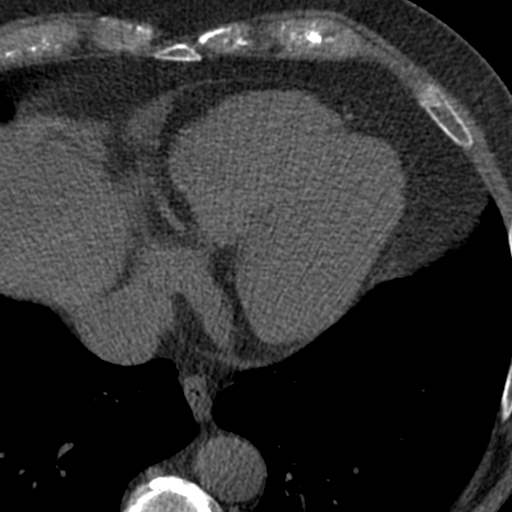
[im 25/61  vessel]
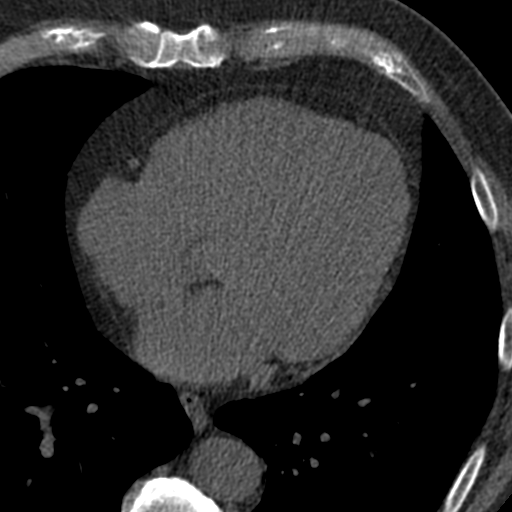
[im 37/61  vessel]
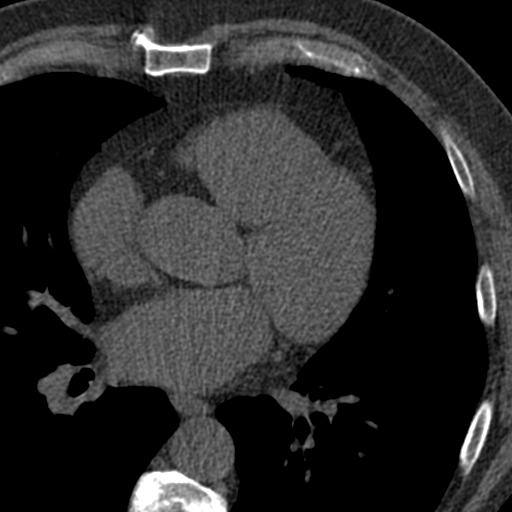
[im 49/61  vessel]
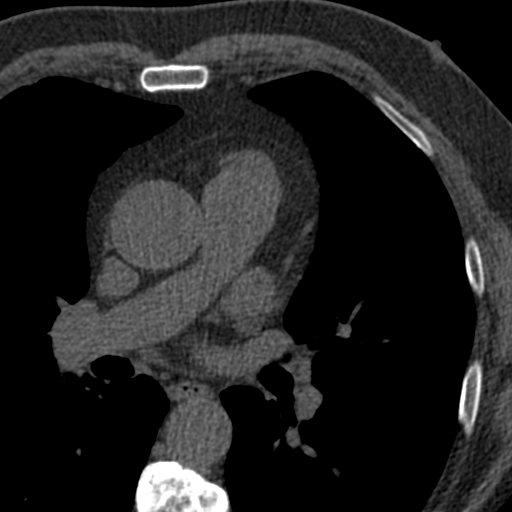

[Series 3: calcium scoring 2.00 br40 bestdiast 69% axial · axial · 0.65mm/px · z∈[+1727,+1807]mm · 5 of 61 slices shown, 7 images]
[im 11/61  vessel]
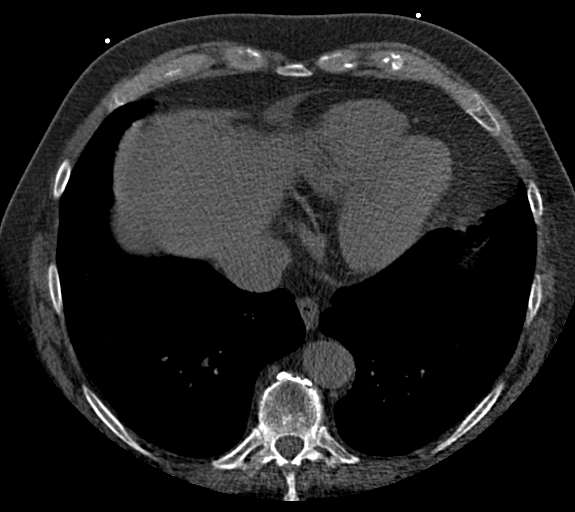
[im 11/61  lung]
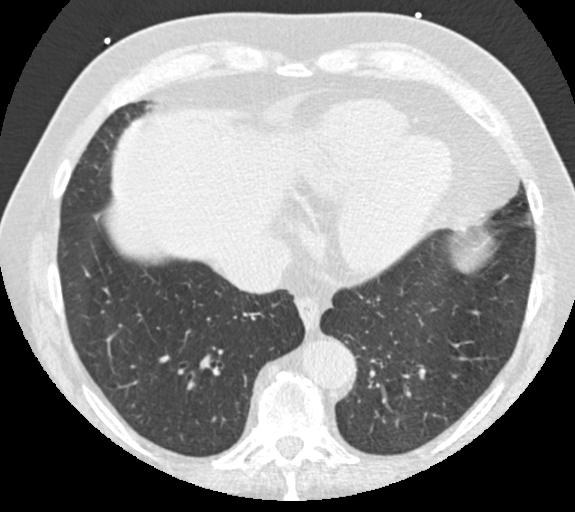
[im 21/61  vessel]
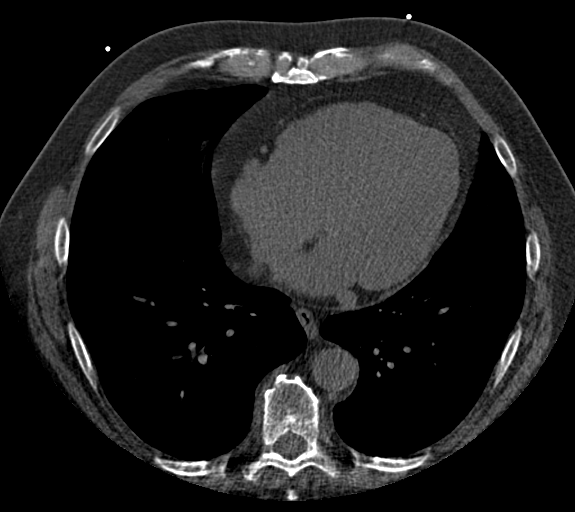
[im 31/61  vessel]
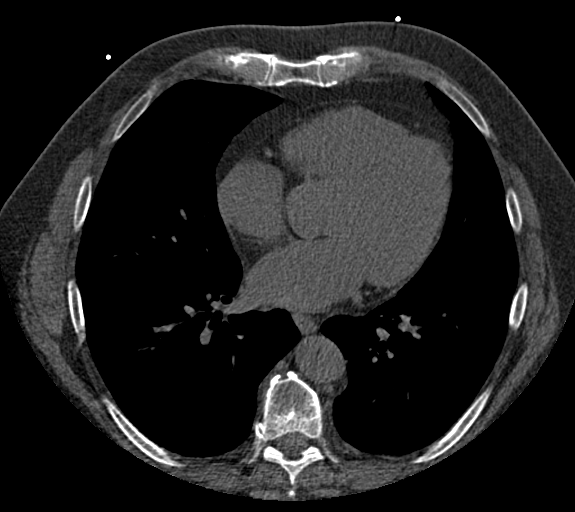
[im 41/61  vessel]
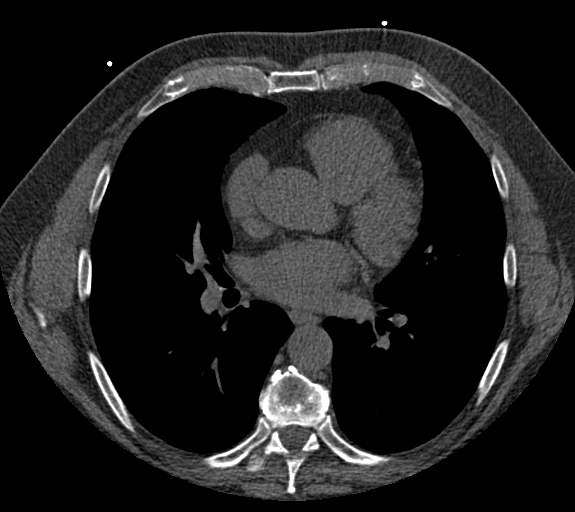
[im 51/61  vessel]
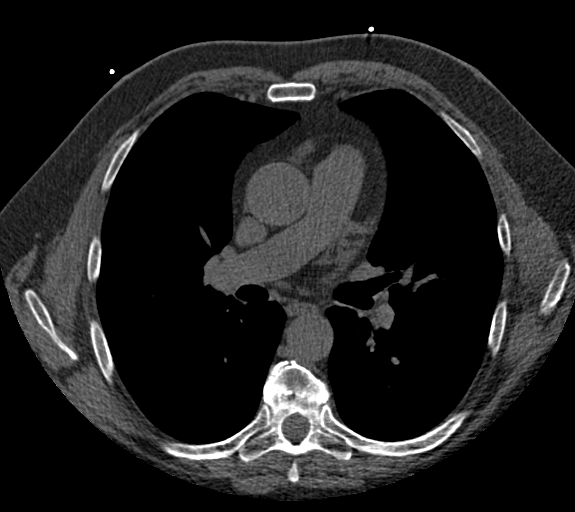
[im 51/61  lung]
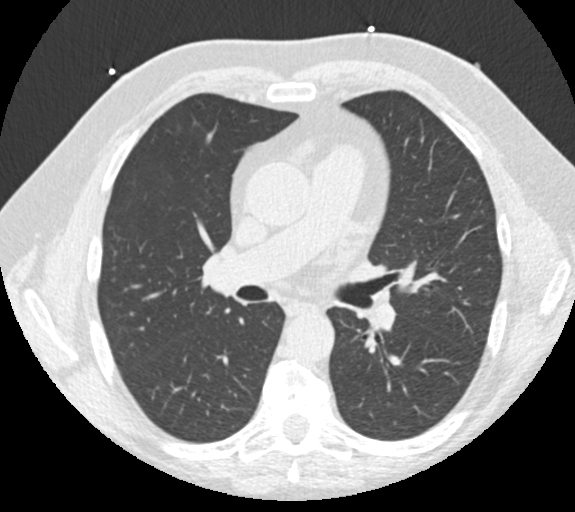

[Series 9: calcium scoring 2.00 br60 bestdiast 69% lungs · axial · 0.65mm/px · z∈[+1727,+1807]mm · 5 of 61 slices shown]
[im 11/61  vessel]
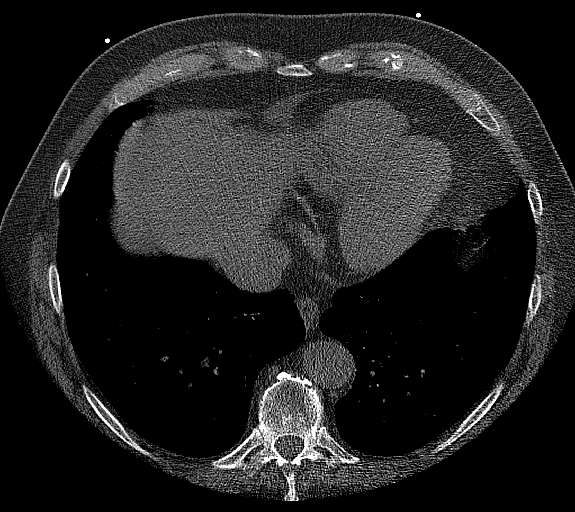
[im 21/61  vessel]
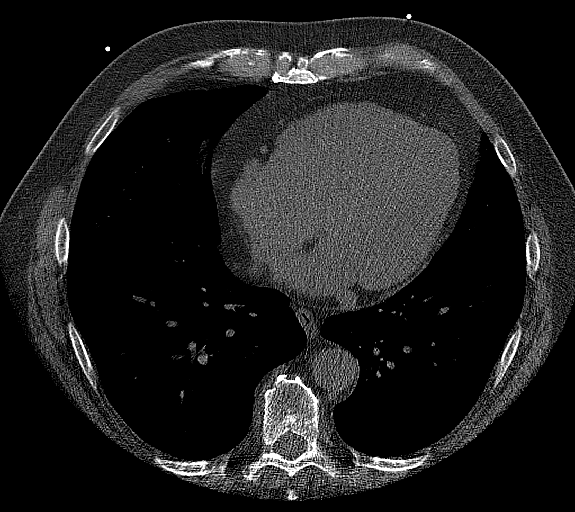
[im 31/61  vessel]
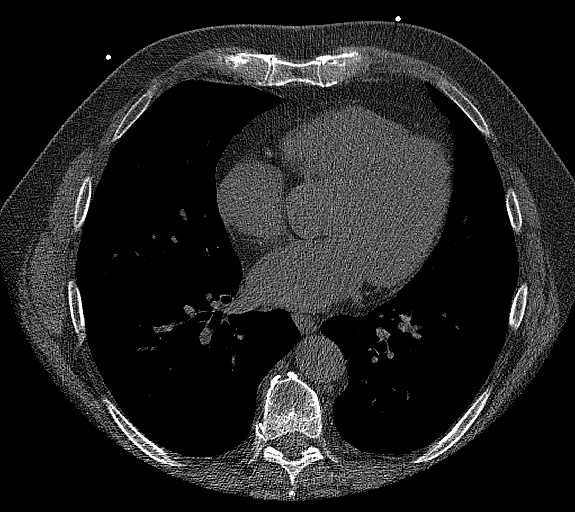
[im 41/61  vessel]
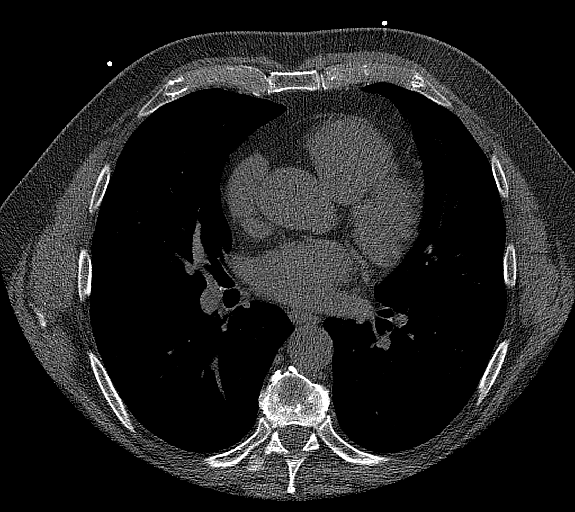
[im 51/61  vessel]
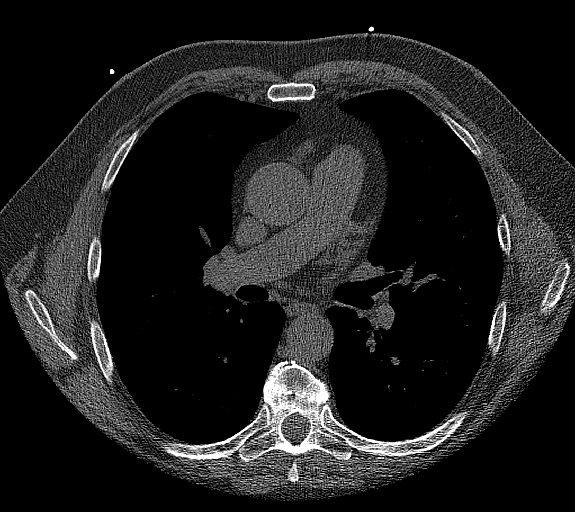

[14 of 20 positions shown; findings below may reference images not displayed]

FINDINGS: CORONARY CALCIUM SCORES:

Left Main: 0

LAD: 0

LCx: 0

RCA: 0

Total Agatston Score: 0

[HOSPITAL] percentile: 0

AORTA MEASUREMENTS:

Ascending Aorta: 41 mm

Descending Aorta: 30 mm

OTHER FINDINGS:

Heart is normal size. Mild aneurysmal dilatation of the ascending
thoracic aorta as measured above. No adenopathy. No confluent
opacities or effusions. Imaging into the upper abdomen demonstrates
no acute findings. Chest wall soft tissues are unremarkable. No
acute bony abnormality.
IMPRESSION: No visible coronary artery calcifications. Total coronary calcium
score of 0.

4.1 cm ascending thoracic aortic aneurysm. Recommend annual imaging
followup by CTA or MRA. This recommendation follows 6535
ACCF/AHA/AATS/ACR/ASA/SCA/FEN/CUBI/MIRENA/AGIRRE Guidelines for the
Diagnosis and Management of Patients with Thoracic Aortic Disease.
Circulation. 6535; 121: E266-e369. Aortic aneurysm NOS ([id number])

## 2021-09-08 ENCOUNTER — Ambulatory Visit
Admission: RE | Admit: 2021-09-08 | Discharge: 2021-09-08 | Disposition: A | Discharge status: Home / Self Care | Payer: Medicare HMO | Source: Ambulatory Visit | Attending: Surgery | Admitting: Surgery

## 2021-09-08 DIAGNOSIS — I7121 Aneurysm of the ascending aorta, without rupture: Secondary | ICD-10-CM

## 2021-09-08 DIAGNOSIS — I700 Atherosclerosis of aorta: Secondary | ICD-10-CM | POA: Diagnosis not present

## 2021-09-15 NOTE — Progress Notes (Unsigned)
[address]       [place] [address]             [phone]       HPI:  Mr. Douglas Carney is a 71 year old lifelong non-smoker with history of BPH, hypertension, arthritis, who is status post resection of melanoma of his right shoulder in 2005.  He was referred to [place] about 1 year ago after having a calcium scoring CT scan by his primary care physician that instantly found a 4.3 cm ascending aortic aneurysm.  This remains asymptomatic.  He did have a syncopal episode in October of last year that was evaluated at the [facility] emergency room with a negative work-up.  He has had no recurrence.  He returns today for scheduled follow-up after having follow-up CTA of his chest on 09/08/2021.   Current Outpatient Medications  Medication Sig Dispense Refill   acetaminophen (TYLENOL) 500 MG tablet Take 1,000 mg by mouth every 6 (six) hours as needed.     fluorouracil (EFUDEX) 5 % cream Apply topically 2 (two) times daily. For sun spot areas to both ears and right temple     losartan (COZAAR) 100 MG tablet Take 100 mg by mouth daily. Takes at 1200 pm     sildenafil (REVATIO) 20 MG tablet Take 20 mg by mouth as needed.     No current facility-administered medications for this visit.    Physical Exam  Diagnostic Tests:  CLINICAL DATA:  A 71 year old male presents for evaluation of aortic aneurysm, follow-up assessment.   EXAM: CT CHEST WITHOUT CONTRAST   TECHNIQUE: Multidetector CT imaging of the chest was performed following the standard protocol without IV contrast.   RADIATION DOSE REDUCTION: This exam was performed according to the departmental dose-optimization program which includes automated exposure control, adjustment of the mA and/or kV according to patient size and/or use of iterative reconstruction technique.   COMPARISON:  Imaging from Jul 15, 2020.   FINDINGS: Cardiovascular: 4 cm ascending thoracic aorta is stable compared to recent imaging.  Scattered aortic atherosclerosis. Tortuosity of descending thoracic aorta. Normal caliber of central pulmonary vessels. Normal heart size.   Mediastinum/Nodes: No thoracic inlet, axillary, mediastinal or hilar adenopathy. Esophagus grossly normal.   Lungs/Pleura: No sign of consolidation. No evidence of pleural effusion. Scattered calcified granulomata in the chest compatible with prior granulomatous disease. Airways are patent.   Upper Abdomen: Moderate hepatic steatosis, liver incompletely imaged. Post cholecystectomy. No biliary duct dilation. Imaged portions of pancreas, spleen, adrenal glands and kidneys are unremarkable.   Musculoskeletal: No acute bone finding. No destructive bone process. Spinal degenerative changes.   IMPRESSION: 1. Stable 4 cm ascending thoracic aorta. Recommend annual imaging followup by CTA or MRA. This recommendation follows 2010 ACCF/AHA/AATS/ACR/ASA/SCA/SCAI/SIR/STS/SVM Guidelines for the Diagnosis and Management of Patients with Thoracic Aortic Disease. Circulation. 2010; 121: D622-W979. Aortic aneurysm NOS (ICD10-I71.9) 2. Moderate hepatic steatosis, liver incompletely imaged. Correlate with any clinical or laboratory evidence of liver disease. 3. Aortic atherosclerosis.   Aortic Atherosclerosis (ICD10-I70.0).     Electronically Signed   By: Zetta Bills M.D.   On: 09/08/2021 11:04   Impression / Plan: Stable and asymptomatic 4.0 cm ascending aortic aneurysm.  This has a very low risk for rupture.   We discussed the importance of blood pressure management, continued surveillance, avoidance of strenuous activity, and avoidance of fluoroquinolones.  Plan follow-up CTA chest in 1 year.   Antony Odea, PA-C [facility] (  [phone]     

## 2021-09-16 ENCOUNTER — Ambulatory Visit: Payer: Medicare HMO | Admitting: Physician Assistant

## 2021-09-16 DIAGNOSIS — I712 Thoracic aortic aneurysm, without rupture, unspecified: Secondary | ICD-10-CM

## 2021-09-16 NOTE — Patient Instructions (Signed)
Continue to avoid strenuous activity with no lifting greater than 40 pounds.  Continue close monitoring and careful management of your blood pressure with a target of less than 130/80.  Avoid fluoroquinolone class of antibiotics.

## 2021-09-22 DIAGNOSIS — M25551 Pain in right hip: Secondary | ICD-10-CM | POA: Diagnosis not present

## 2021-09-22 DIAGNOSIS — M5441 Lumbago with sciatica, right side: Secondary | ICD-10-CM | POA: Diagnosis not present

## 2021-09-22 DIAGNOSIS — M1611 Unilateral primary osteoarthritis, right hip: Secondary | ICD-10-CM | POA: Diagnosis not present

## 2021-09-22 DIAGNOSIS — G8929 Other chronic pain: Secondary | ICD-10-CM | POA: Diagnosis not present

## 2021-10-03 DIAGNOSIS — M25551 Pain in right hip: Secondary | ICD-10-CM | POA: Diagnosis not present

## 2021-10-03 DIAGNOSIS — M6281 Muscle weakness (generalized): Secondary | ICD-10-CM | POA: Diagnosis not present

## 2021-10-10 DIAGNOSIS — M6281 Muscle weakness (generalized): Secondary | ICD-10-CM | POA: Diagnosis not present

## 2021-10-10 DIAGNOSIS — M25551 Pain in right hip: Secondary | ICD-10-CM | POA: Diagnosis not present

## 2021-10-20 DIAGNOSIS — M6281 Muscle weakness (generalized): Secondary | ICD-10-CM | POA: Diagnosis not present

## 2021-10-20 DIAGNOSIS — M25551 Pain in right hip: Secondary | ICD-10-CM | POA: Diagnosis not present

## 2021-10-24 DIAGNOSIS — M25551 Pain in right hip: Secondary | ICD-10-CM | POA: Diagnosis not present

## 2021-10-24 DIAGNOSIS — M6281 Muscle weakness (generalized): Secondary | ICD-10-CM | POA: Diagnosis not present

## 2021-10-27 DIAGNOSIS — M25551 Pain in right hip: Secondary | ICD-10-CM | POA: Diagnosis not present

## 2021-10-27 DIAGNOSIS — M6281 Muscle weakness (generalized): Secondary | ICD-10-CM | POA: Diagnosis not present

## 2021-10-31 DIAGNOSIS — M6281 Muscle weakness (generalized): Secondary | ICD-10-CM | POA: Diagnosis not present

## 2021-10-31 DIAGNOSIS — M25551 Pain in right hip: Secondary | ICD-10-CM | POA: Diagnosis not present

## 2022-01-02 ENCOUNTER — Other Ambulatory Visit: Payer: Self-pay | Admitting: Internal Medicine

## 2022-01-02 DIAGNOSIS — L821 Other seborrheic keratosis: Secondary | ICD-10-CM | POA: Diagnosis not present

## 2022-01-02 DIAGNOSIS — I712 Thoracic aortic aneurysm, without rupture, unspecified: Secondary | ICD-10-CM

## 2022-01-02 DIAGNOSIS — L570 Actinic keratosis: Secondary | ICD-10-CM | POA: Diagnosis not present

## 2022-01-02 DIAGNOSIS — L814 Other melanin hyperpigmentation: Secondary | ICD-10-CM | POA: Diagnosis not present

## 2022-01-02 DIAGNOSIS — L820 Inflamed seborrheic keratosis: Secondary | ICD-10-CM | POA: Diagnosis not present

## 2022-01-11 IMAGING — DX DG CHEST 1V PORT
1 series · 1 of 1 positions shown · non-contrast
Comparison: Cardiac CT dated 07/15/2020.

CLINICAL DATA: Chest pain/shortness of breath.

EXAM:
PORTABLE CHEST 1 VIEW

[chest]
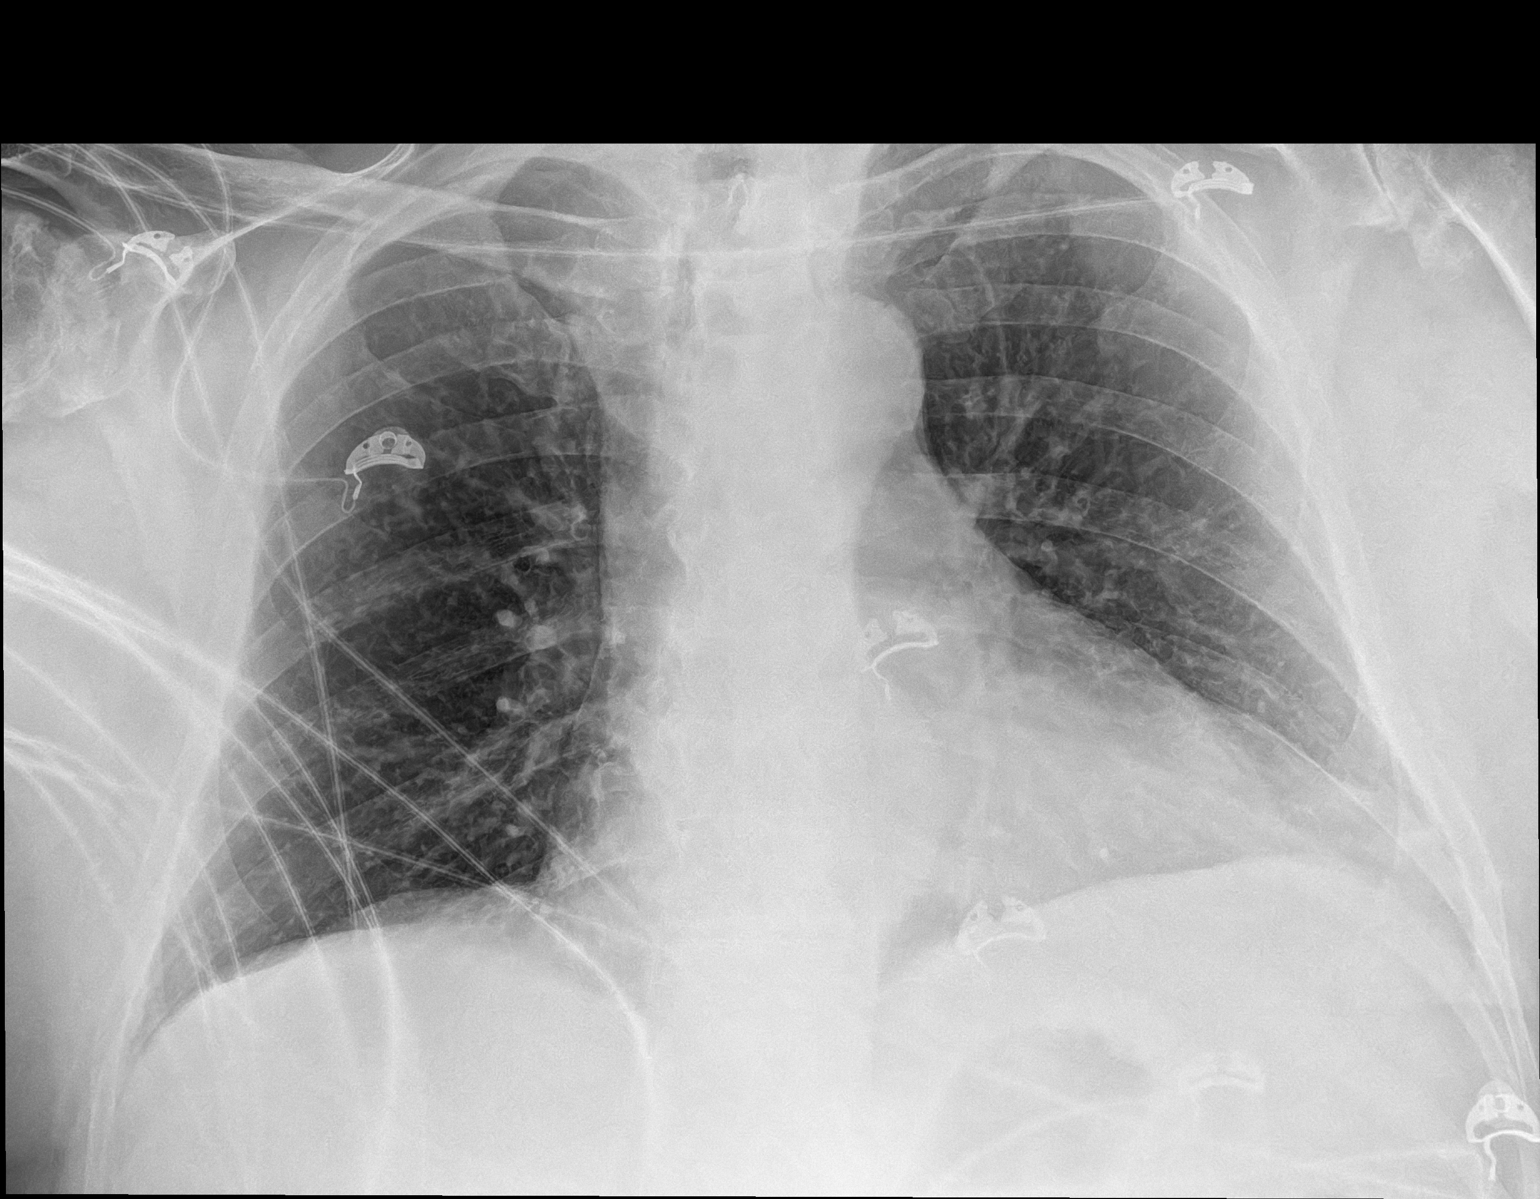

[1 of 1 positions shown; findings below may reference images not displayed]

FINDINGS: The heart size and mediastinal contours are within normal limits.
There is mild left basilar atelectasis/airspace disease. The right
lung is clear. There is no pleural effusion or pneumothorax.
Degenerative changes are seen in the spine.
IMPRESSION: Mild left basilar atelectasis/airspace disease.

## 2022-01-27 DIAGNOSIS — Z01818 Encounter for other preprocedural examination: Secondary | ICD-10-CM | POA: Diagnosis not present

## 2022-02-04 DIAGNOSIS — N400 Enlarged prostate without lower urinary tract symptoms: Secondary | ICD-10-CM | POA: Diagnosis not present

## 2022-02-04 DIAGNOSIS — Z96641 Presence of right artificial hip joint: Secondary | ICD-10-CM | POA: Diagnosis not present

## 2022-02-04 DIAGNOSIS — Z8249 Family history of ischemic heart disease and other diseases of the circulatory system: Secondary | ICD-10-CM | POA: Diagnosis not present

## 2022-02-04 DIAGNOSIS — Z8679 Personal history of other diseases of the circulatory system: Secondary | ICD-10-CM | POA: Diagnosis not present

## 2022-02-04 DIAGNOSIS — Z6830 Body mass index (BMI) 30.0-30.9, adult: Secondary | ICD-10-CM | POA: Diagnosis not present

## 2022-02-04 DIAGNOSIS — R54 Age-related physical debility: Secondary | ICD-10-CM | POA: Diagnosis not present

## 2022-02-04 DIAGNOSIS — Z8042 Family history of malignant neoplasm of prostate: Secondary | ICD-10-CM | POA: Diagnosis not present

## 2022-02-04 DIAGNOSIS — I444 Left anterior fascicular block: Secondary | ICD-10-CM | POA: Diagnosis not present

## 2022-02-04 DIAGNOSIS — G4733 Obstructive sleep apnea (adult) (pediatric): Secondary | ICD-10-CM | POA: Diagnosis not present

## 2022-02-04 DIAGNOSIS — M1611 Unilateral primary osteoarthritis, right hip: Secondary | ICD-10-CM | POA: Diagnosis not present

## 2022-02-04 DIAGNOSIS — G8918 Other acute postprocedural pain: Secondary | ICD-10-CM | POA: Diagnosis not present

## 2022-02-04 DIAGNOSIS — E669 Obesity, unspecified: Secondary | ICD-10-CM | POA: Diagnosis not present

## 2022-02-04 DIAGNOSIS — I10 Essential (primary) hypertension: Secondary | ICD-10-CM | POA: Diagnosis not present

## 2022-02-04 DIAGNOSIS — G8929 Other chronic pain: Secondary | ICD-10-CM | POA: Diagnosis not present

## 2022-02-04 DIAGNOSIS — M25551 Pain in right hip: Secondary | ICD-10-CM | POA: Diagnosis not present

## 2022-02-05 DIAGNOSIS — Z8249 Family history of ischemic heart disease and other diseases of the circulatory system: Secondary | ICD-10-CM | POA: Diagnosis not present

## 2022-02-05 DIAGNOSIS — G8929 Other chronic pain: Secondary | ICD-10-CM | POA: Diagnosis not present

## 2022-02-05 DIAGNOSIS — I10 Essential (primary) hypertension: Secondary | ICD-10-CM | POA: Diagnosis not present

## 2022-02-05 DIAGNOSIS — Z8042 Family history of malignant neoplasm of prostate: Secondary | ICD-10-CM | POA: Diagnosis not present

## 2022-02-05 DIAGNOSIS — Z8679 Personal history of other diseases of the circulatory system: Secondary | ICD-10-CM | POA: Diagnosis not present

## 2022-02-05 DIAGNOSIS — I444 Left anterior fascicular block: Secondary | ICD-10-CM | POA: Diagnosis not present

## 2022-02-05 DIAGNOSIS — R54 Age-related physical debility: Secondary | ICD-10-CM | POA: Diagnosis not present

## 2022-02-05 DIAGNOSIS — M1611 Unilateral primary osteoarthritis, right hip: Secondary | ICD-10-CM | POA: Diagnosis not present

## 2022-02-05 DIAGNOSIS — N400 Enlarged prostate without lower urinary tract symptoms: Secondary | ICD-10-CM | POA: Diagnosis not present

## 2022-02-06 DIAGNOSIS — G8929 Other chronic pain: Secondary | ICD-10-CM | POA: Diagnosis not present

## 2022-02-06 DIAGNOSIS — Z8042 Family history of malignant neoplasm of prostate: Secondary | ICD-10-CM | POA: Diagnosis not present

## 2022-02-06 DIAGNOSIS — M25552 Pain in left hip: Secondary | ICD-10-CM | POA: Diagnosis not present

## 2022-02-06 DIAGNOSIS — I444 Left anterior fascicular block: Secondary | ICD-10-CM | POA: Diagnosis not present

## 2022-02-06 DIAGNOSIS — R54 Age-related physical debility: Secondary | ICD-10-CM | POA: Diagnosis not present

## 2022-02-06 DIAGNOSIS — I10 Essential (primary) hypertension: Secondary | ICD-10-CM | POA: Diagnosis not present

## 2022-02-06 DIAGNOSIS — M1611 Unilateral primary osteoarthritis, right hip: Secondary | ICD-10-CM | POA: Diagnosis not present

## 2022-02-06 DIAGNOSIS — Z8249 Family history of ischemic heart disease and other diseases of the circulatory system: Secondary | ICD-10-CM | POA: Diagnosis not present

## 2022-02-06 DIAGNOSIS — N400 Enlarged prostate without lower urinary tract symptoms: Secondary | ICD-10-CM | POA: Diagnosis not present

## 2022-02-06 DIAGNOSIS — Z8679 Personal history of other diseases of the circulatory system: Secondary | ICD-10-CM | POA: Diagnosis not present

## 2022-02-08 DIAGNOSIS — Z471 Aftercare following joint replacement surgery: Secondary | ICD-10-CM | POA: Diagnosis not present

## 2022-02-08 DIAGNOSIS — R2689 Other abnormalities of gait and mobility: Secondary | ICD-10-CM | POA: Diagnosis not present

## 2022-02-08 DIAGNOSIS — M6281 Muscle weakness (generalized): Secondary | ICD-10-CM | POA: Diagnosis not present

## 2022-02-08 DIAGNOSIS — M1611 Unilateral primary osteoarthritis, right hip: Secondary | ICD-10-CM | POA: Diagnosis not present

## 2022-02-08 DIAGNOSIS — R279 Unspecified lack of coordination: Secondary | ICD-10-CM | POA: Diagnosis not present

## 2022-02-08 DIAGNOSIS — I10 Essential (primary) hypertension: Secondary | ICD-10-CM | POA: Diagnosis not present

## 2022-02-09 DIAGNOSIS — M6281 Muscle weakness (generalized): Secondary | ICD-10-CM | POA: Diagnosis not present

## 2022-02-09 DIAGNOSIS — Z471 Aftercare following joint replacement surgery: Secondary | ICD-10-CM | POA: Diagnosis not present

## 2022-02-09 DIAGNOSIS — R2689 Other abnormalities of gait and mobility: Secondary | ICD-10-CM | POA: Diagnosis not present

## 2022-02-09 DIAGNOSIS — R279 Unspecified lack of coordination: Secondary | ICD-10-CM | POA: Diagnosis not present

## 2022-02-09 DIAGNOSIS — M1611 Unilateral primary osteoarthritis, right hip: Secondary | ICD-10-CM | POA: Diagnosis not present

## 2022-02-09 DIAGNOSIS — I10 Essential (primary) hypertension: Secondary | ICD-10-CM | POA: Diagnosis not present

## 2022-02-10 DIAGNOSIS — R2689 Other abnormalities of gait and mobility: Secondary | ICD-10-CM | POA: Diagnosis not present

## 2022-02-10 DIAGNOSIS — I10 Essential (primary) hypertension: Secondary | ICD-10-CM | POA: Diagnosis not present

## 2022-02-10 DIAGNOSIS — G8918 Other acute postprocedural pain: Secondary | ICD-10-CM | POA: Diagnosis not present

## 2022-02-10 DIAGNOSIS — M1611 Unilateral primary osteoarthritis, right hip: Secondary | ICD-10-CM | POA: Diagnosis not present

## 2022-02-10 DIAGNOSIS — M6281 Muscle weakness (generalized): Secondary | ICD-10-CM | POA: Diagnosis not present

## 2022-02-10 DIAGNOSIS — Z471 Aftercare following joint replacement surgery: Secondary | ICD-10-CM | POA: Diagnosis not present

## 2022-02-10 DIAGNOSIS — I119 Hypertensive heart disease without heart failure: Secondary | ICD-10-CM | POA: Diagnosis not present

## 2022-02-10 DIAGNOSIS — R279 Unspecified lack of coordination: Secondary | ICD-10-CM | POA: Diagnosis not present

## 2022-02-10 DIAGNOSIS — R262 Difficulty in walking, not elsewhere classified: Secondary | ICD-10-CM | POA: Diagnosis not present

## 2022-02-10 DIAGNOSIS — Z96641 Presence of right artificial hip joint: Secondary | ICD-10-CM | POA: Diagnosis not present

## 2022-02-11 DIAGNOSIS — M1611 Unilateral primary osteoarthritis, right hip: Secondary | ICD-10-CM | POA: Diagnosis not present

## 2022-02-11 DIAGNOSIS — I10 Essential (primary) hypertension: Secondary | ICD-10-CM | POA: Diagnosis not present

## 2022-02-11 DIAGNOSIS — Z471 Aftercare following joint replacement surgery: Secondary | ICD-10-CM | POA: Diagnosis not present

## 2022-02-11 DIAGNOSIS — R2689 Other abnormalities of gait and mobility: Secondary | ICD-10-CM | POA: Diagnosis not present

## 2022-02-11 DIAGNOSIS — R279 Unspecified lack of coordination: Secondary | ICD-10-CM | POA: Diagnosis not present

## 2022-02-11 DIAGNOSIS — M6281 Muscle weakness (generalized): Secondary | ICD-10-CM | POA: Diagnosis not present

## 2022-02-11 DIAGNOSIS — R52 Pain, unspecified: Secondary | ICD-10-CM | POA: Diagnosis not present

## 2022-02-12 ENCOUNTER — Other Ambulatory Visit: Payer: Medicare HMO

## 2022-02-12 DIAGNOSIS — M1611 Unilateral primary osteoarthritis, right hip: Secondary | ICD-10-CM | POA: Diagnosis not present

## 2022-02-12 DIAGNOSIS — R279 Unspecified lack of coordination: Secondary | ICD-10-CM | POA: Diagnosis not present

## 2022-02-12 DIAGNOSIS — R2689 Other abnormalities of gait and mobility: Secondary | ICD-10-CM | POA: Diagnosis not present

## 2022-02-12 DIAGNOSIS — I10 Essential (primary) hypertension: Secondary | ICD-10-CM | POA: Diagnosis not present

## 2022-02-12 DIAGNOSIS — M6281 Muscle weakness (generalized): Secondary | ICD-10-CM | POA: Diagnosis not present

## 2022-02-12 DIAGNOSIS — Z471 Aftercare following joint replacement surgery: Secondary | ICD-10-CM | POA: Diagnosis not present

## 2022-02-13 DIAGNOSIS — Z471 Aftercare following joint replacement surgery: Secondary | ICD-10-CM | POA: Diagnosis not present

## 2022-02-13 DIAGNOSIS — I10 Essential (primary) hypertension: Secondary | ICD-10-CM | POA: Diagnosis not present

## 2022-02-13 DIAGNOSIS — M1611 Unilateral primary osteoarthritis, right hip: Secondary | ICD-10-CM | POA: Diagnosis not present

## 2022-02-13 DIAGNOSIS — M6281 Muscle weakness (generalized): Secondary | ICD-10-CM | POA: Diagnosis not present

## 2022-02-13 DIAGNOSIS — R2689 Other abnormalities of gait and mobility: Secondary | ICD-10-CM | POA: Diagnosis not present

## 2022-02-13 DIAGNOSIS — R279 Unspecified lack of coordination: Secondary | ICD-10-CM | POA: Diagnosis not present

## 2022-02-14 DIAGNOSIS — Z471 Aftercare following joint replacement surgery: Secondary | ICD-10-CM | POA: Diagnosis not present

## 2022-02-14 DIAGNOSIS — M6281 Muscle weakness (generalized): Secondary | ICD-10-CM | POA: Diagnosis not present

## 2022-02-14 DIAGNOSIS — M1611 Unilateral primary osteoarthritis, right hip: Secondary | ICD-10-CM | POA: Diagnosis not present

## 2022-02-14 DIAGNOSIS — I10 Essential (primary) hypertension: Secondary | ICD-10-CM | POA: Diagnosis not present

## 2022-02-14 DIAGNOSIS — R279 Unspecified lack of coordination: Secondary | ICD-10-CM | POA: Diagnosis not present

## 2022-02-14 DIAGNOSIS — R2689 Other abnormalities of gait and mobility: Secondary | ICD-10-CM | POA: Diagnosis not present

## 2022-02-16 DIAGNOSIS — M1611 Unilateral primary osteoarthritis, right hip: Secondary | ICD-10-CM | POA: Diagnosis not present

## 2022-02-16 DIAGNOSIS — M6281 Muscle weakness (generalized): Secondary | ICD-10-CM | POA: Diagnosis not present

## 2022-02-16 DIAGNOSIS — R2689 Other abnormalities of gait and mobility: Secondary | ICD-10-CM | POA: Diagnosis not present

## 2022-02-16 DIAGNOSIS — Z471 Aftercare following joint replacement surgery: Secondary | ICD-10-CM | POA: Diagnosis not present

## 2022-02-16 DIAGNOSIS — I10 Essential (primary) hypertension: Secondary | ICD-10-CM | POA: Diagnosis not present

## 2022-02-16 DIAGNOSIS — R279 Unspecified lack of coordination: Secondary | ICD-10-CM | POA: Diagnosis not present

## 2022-02-17 DIAGNOSIS — I10 Essential (primary) hypertension: Secondary | ICD-10-CM | POA: Diagnosis not present

## 2022-02-17 DIAGNOSIS — M6281 Muscle weakness (generalized): Secondary | ICD-10-CM | POA: Diagnosis not present

## 2022-02-17 DIAGNOSIS — R279 Unspecified lack of coordination: Secondary | ICD-10-CM | POA: Diagnosis not present

## 2022-02-17 DIAGNOSIS — Z471 Aftercare following joint replacement surgery: Secondary | ICD-10-CM | POA: Diagnosis not present

## 2022-02-17 DIAGNOSIS — M1611 Unilateral primary osteoarthritis, right hip: Secondary | ICD-10-CM | POA: Diagnosis not present

## 2022-02-17 DIAGNOSIS — R2689 Other abnormalities of gait and mobility: Secondary | ICD-10-CM | POA: Diagnosis not present

## 2022-02-18 DIAGNOSIS — Z471 Aftercare following joint replacement surgery: Secondary | ICD-10-CM | POA: Diagnosis not present

## 2022-02-18 DIAGNOSIS — M1611 Unilateral primary osteoarthritis, right hip: Secondary | ICD-10-CM | POA: Diagnosis not present

## 2022-02-18 DIAGNOSIS — M6281 Muscle weakness (generalized): Secondary | ICD-10-CM | POA: Diagnosis not present

## 2022-02-18 DIAGNOSIS — R279 Unspecified lack of coordination: Secondary | ICD-10-CM | POA: Diagnosis not present

## 2022-02-18 DIAGNOSIS — I10 Essential (primary) hypertension: Secondary | ICD-10-CM | POA: Diagnosis not present

## 2022-02-18 DIAGNOSIS — R2689 Other abnormalities of gait and mobility: Secondary | ICD-10-CM | POA: Diagnosis not present

## 2022-02-20 DIAGNOSIS — I10 Essential (primary) hypertension: Secondary | ICD-10-CM | POA: Diagnosis not present

## 2022-02-20 DIAGNOSIS — R600 Localized edema: Secondary | ICD-10-CM | POA: Diagnosis not present

## 2022-03-06 DIAGNOSIS — Z471 Aftercare following joint replacement surgery: Secondary | ICD-10-CM | POA: Diagnosis not present

## 2022-03-06 DIAGNOSIS — M25551 Pain in right hip: Secondary | ICD-10-CM | POA: Diagnosis not present

## 2022-03-06 DIAGNOSIS — R262 Difficulty in walking, not elsewhere classified: Secondary | ICD-10-CM | POA: Diagnosis not present

## 2022-03-06 DIAGNOSIS — M25651 Stiffness of right hip, not elsewhere classified: Secondary | ICD-10-CM | POA: Diagnosis not present

## 2022-03-06 DIAGNOSIS — R600 Localized edema: Secondary | ICD-10-CM | POA: Diagnosis not present

## 2022-03-06 DIAGNOSIS — M25512 Pain in left shoulder: Secondary | ICD-10-CM | POA: Diagnosis not present

## 2022-03-06 DIAGNOSIS — M25511 Pain in right shoulder: Secondary | ICD-10-CM | POA: Diagnosis not present

## 2022-03-09 DIAGNOSIS — M1611 Unilateral primary osteoarthritis, right hip: Secondary | ICD-10-CM | POA: Diagnosis not present

## 2022-03-10 DIAGNOSIS — M25551 Pain in right hip: Secondary | ICD-10-CM | POA: Diagnosis not present

## 2022-03-10 DIAGNOSIS — M25651 Stiffness of right hip, not elsewhere classified: Secondary | ICD-10-CM | POA: Diagnosis not present

## 2022-03-10 DIAGNOSIS — R262 Difficulty in walking, not elsewhere classified: Secondary | ICD-10-CM | POA: Diagnosis not present

## 2022-03-10 DIAGNOSIS — Z471 Aftercare following joint replacement surgery: Secondary | ICD-10-CM | POA: Diagnosis not present

## 2022-03-12 DIAGNOSIS — R262 Difficulty in walking, not elsewhere classified: Secondary | ICD-10-CM | POA: Diagnosis not present

## 2022-03-12 DIAGNOSIS — M25651 Stiffness of right hip, not elsewhere classified: Secondary | ICD-10-CM | POA: Diagnosis not present

## 2022-03-12 DIAGNOSIS — Z471 Aftercare following joint replacement surgery: Secondary | ICD-10-CM | POA: Diagnosis not present

## 2022-03-12 DIAGNOSIS — M25551 Pain in right hip: Secondary | ICD-10-CM | POA: Diagnosis not present

## 2022-03-13 DIAGNOSIS — M25651 Stiffness of right hip, not elsewhere classified: Secondary | ICD-10-CM | POA: Diagnosis not present

## 2022-03-13 DIAGNOSIS — Z471 Aftercare following joint replacement surgery: Secondary | ICD-10-CM | POA: Diagnosis not present

## 2022-03-13 DIAGNOSIS — R262 Difficulty in walking, not elsewhere classified: Secondary | ICD-10-CM | POA: Diagnosis not present

## 2022-03-13 DIAGNOSIS — M25551 Pain in right hip: Secondary | ICD-10-CM | POA: Diagnosis not present

## 2022-03-16 DIAGNOSIS — M25551 Pain in right hip: Secondary | ICD-10-CM | POA: Diagnosis not present

## 2022-03-16 DIAGNOSIS — Z471 Aftercare following joint replacement surgery: Secondary | ICD-10-CM | POA: Diagnosis not present

## 2022-03-16 DIAGNOSIS — R262 Difficulty in walking, not elsewhere classified: Secondary | ICD-10-CM | POA: Diagnosis not present

## 2022-03-16 DIAGNOSIS — M25651 Stiffness of right hip, not elsewhere classified: Secondary | ICD-10-CM | POA: Diagnosis not present

## 2022-03-18 DIAGNOSIS — M25551 Pain in right hip: Secondary | ICD-10-CM | POA: Diagnosis not present

## 2022-03-18 DIAGNOSIS — R262 Difficulty in walking, not elsewhere classified: Secondary | ICD-10-CM | POA: Diagnosis not present

## 2022-03-18 DIAGNOSIS — M25651 Stiffness of right hip, not elsewhere classified: Secondary | ICD-10-CM | POA: Diagnosis not present

## 2022-03-18 DIAGNOSIS — Z471 Aftercare following joint replacement surgery: Secondary | ICD-10-CM | POA: Diagnosis not present

## 2022-03-19 DIAGNOSIS — M25651 Stiffness of right hip, not elsewhere classified: Secondary | ICD-10-CM | POA: Diagnosis not present

## 2022-03-19 DIAGNOSIS — R262 Difficulty in walking, not elsewhere classified: Secondary | ICD-10-CM | POA: Diagnosis not present

## 2022-03-19 DIAGNOSIS — M25551 Pain in right hip: Secondary | ICD-10-CM | POA: Diagnosis not present

## 2022-03-19 DIAGNOSIS — Z471 Aftercare following joint replacement surgery: Secondary | ICD-10-CM | POA: Diagnosis not present

## 2022-03-25 DIAGNOSIS — Z96641 Presence of right artificial hip joint: Secondary | ICD-10-CM | POA: Diagnosis not present

## 2022-03-25 DIAGNOSIS — M159 Polyosteoarthritis, unspecified: Secondary | ICD-10-CM | POA: Diagnosis not present

## 2022-03-25 DIAGNOSIS — M1612 Unilateral primary osteoarthritis, left hip: Secondary | ICD-10-CM | POA: Diagnosis not present

## 2022-03-25 DIAGNOSIS — Z471 Aftercare following joint replacement surgery: Secondary | ICD-10-CM | POA: Diagnosis not present

## 2022-03-26 DIAGNOSIS — M25551 Pain in right hip: Secondary | ICD-10-CM | POA: Diagnosis not present

## 2022-03-26 DIAGNOSIS — Z471 Aftercare following joint replacement surgery: Secondary | ICD-10-CM | POA: Diagnosis not present

## 2022-03-26 DIAGNOSIS — R262 Difficulty in walking, not elsewhere classified: Secondary | ICD-10-CM | POA: Diagnosis not present

## 2022-03-26 DIAGNOSIS — M25651 Stiffness of right hip, not elsewhere classified: Secondary | ICD-10-CM | POA: Diagnosis not present

## 2022-03-27 DIAGNOSIS — M25651 Stiffness of right hip, not elsewhere classified: Secondary | ICD-10-CM | POA: Diagnosis not present

## 2022-03-27 DIAGNOSIS — Z471 Aftercare following joint replacement surgery: Secondary | ICD-10-CM | POA: Diagnosis not present

## 2022-03-27 DIAGNOSIS — R262 Difficulty in walking, not elsewhere classified: Secondary | ICD-10-CM | POA: Diagnosis not present

## 2022-03-27 DIAGNOSIS — M25551 Pain in right hip: Secondary | ICD-10-CM | POA: Diagnosis not present

## 2022-03-30 DIAGNOSIS — R262 Difficulty in walking, not elsewhere classified: Secondary | ICD-10-CM | POA: Diagnosis not present

## 2022-03-30 DIAGNOSIS — M25551 Pain in right hip: Secondary | ICD-10-CM | POA: Diagnosis not present

## 2022-03-30 DIAGNOSIS — M25651 Stiffness of right hip, not elsewhere classified: Secondary | ICD-10-CM | POA: Diagnosis not present

## 2022-03-30 DIAGNOSIS — Z471 Aftercare following joint replacement surgery: Secondary | ICD-10-CM | POA: Diagnosis not present

## 2022-04-01 DIAGNOSIS — M25551 Pain in right hip: Secondary | ICD-10-CM | POA: Diagnosis not present

## 2022-04-01 DIAGNOSIS — R262 Difficulty in walking, not elsewhere classified: Secondary | ICD-10-CM | POA: Diagnosis not present

## 2022-04-01 DIAGNOSIS — M25651 Stiffness of right hip, not elsewhere classified: Secondary | ICD-10-CM | POA: Diagnosis not present

## 2022-04-01 DIAGNOSIS — Z471 Aftercare following joint replacement surgery: Secondary | ICD-10-CM | POA: Diagnosis not present

## 2022-04-03 DIAGNOSIS — Z471 Aftercare following joint replacement surgery: Secondary | ICD-10-CM | POA: Diagnosis not present

## 2022-04-03 DIAGNOSIS — R262 Difficulty in walking, not elsewhere classified: Secondary | ICD-10-CM | POA: Diagnosis not present

## 2022-04-03 DIAGNOSIS — M25551 Pain in right hip: Secondary | ICD-10-CM | POA: Diagnosis not present

## 2022-04-03 DIAGNOSIS — M25651 Stiffness of right hip, not elsewhere classified: Secondary | ICD-10-CM | POA: Diagnosis not present

## 2022-04-06 DIAGNOSIS — Z96641 Presence of right artificial hip joint: Secondary | ICD-10-CM | POA: Diagnosis not present

## 2022-04-06 DIAGNOSIS — M1611 Unilateral primary osteoarthritis, right hip: Secondary | ICD-10-CM | POA: Diagnosis not present

## 2022-04-07 DIAGNOSIS — R262 Difficulty in walking, not elsewhere classified: Secondary | ICD-10-CM | POA: Diagnosis not present

## 2022-04-07 DIAGNOSIS — Z471 Aftercare following joint replacement surgery: Secondary | ICD-10-CM | POA: Diagnosis not present

## 2022-04-07 DIAGNOSIS — M25651 Stiffness of right hip, not elsewhere classified: Secondary | ICD-10-CM | POA: Diagnosis not present

## 2022-04-07 DIAGNOSIS — M25551 Pain in right hip: Secondary | ICD-10-CM | POA: Diagnosis not present

## 2022-04-09 DIAGNOSIS — M25651 Stiffness of right hip, not elsewhere classified: Secondary | ICD-10-CM | POA: Diagnosis not present

## 2022-04-09 DIAGNOSIS — R262 Difficulty in walking, not elsewhere classified: Secondary | ICD-10-CM | POA: Diagnosis not present

## 2022-04-09 DIAGNOSIS — Z471 Aftercare following joint replacement surgery: Secondary | ICD-10-CM | POA: Diagnosis not present

## 2022-04-09 DIAGNOSIS — M25551 Pain in right hip: Secondary | ICD-10-CM | POA: Diagnosis not present

## 2022-04-09 NOTE — Progress Notes (Signed)
Chief Complaint  Patient presents with   Follow-up    SVT   History of Present Illness: 72 yo male with history of BPH, HTN, thoracic aortic aneurym and SVT who is here today for follow up. I saw him as a new patient for the evaluation of SVT in January 2023. He was seen in the [facility] ED 12/21/20 after a syncopal episode that occurred one day after his Covid 19 booster. He wore a cardiac monitor for 14 days in October/November 2022 and this showed several short runs of SVT. CT coronary calcium score of zero in May 2022. 4.1 cm ascending aortic aneurysm noted on non-contrast CT scan in May 2022. Echo 03/25/21 with LVEF=60-65%, moderate asymmetric hypertrophy of the basal septum. Trivial MR. Chest CTA with 4.0 cm ascending aorta. This is followed in the CT surgery office by Dr. Cyndia Bent.   He is here today for follow up. The patient denies any chest pain, dyspnea, palpitations, lower extremity edema, orthopnea, PND, dizziness, near syncope or syncope. He had hip surgery  I took care of his partner Ashley Royalty before he passed in April 2023.   Primary Care Physician: Deland Pretty, MD   Past Medical History:  Diagnosis Date   Arthritis    right hip   BPH (benign prostatic hyperplasia)    Cancer ([place]) 2005   melanoma removed right shoulder   COVID 1-18-2-22   fever chills body aches and cough x 5 days all symptoms resolved   Family history of adverse reaction to anesthesia    mother had brain fog after anesthesia lasted 4 to 5 dsys   Hypertension    Syncope    Wears glasses     Past Surgical History:  Procedure Laterality Date   APPENDECTOMY     right shoulder fracture surgery  07/14/2003   right shoulder pins removed  2027   wires and screws also removed   TONSILLECTOMY  age 56   and adenoids removed   TRANSURETHRAL RESECTION OF PROSTATE N/A 04/12/2020   Procedure: TRANSURETHRAL RESECTION OF THE PROSTATE (TURP);  Surgeon: Irine Seal, MD;  Location: [facility];   Service: Urology;  Laterality: N/A;    Current Outpatient Medications  Medication Sig Dispense Refill   fluorouracil (EFUDEX) 5 % cream Apply topically 2 (two) times daily. For sun spot areas to both ears and right temple     losartan-hydrochlorothiazide (HYZAAR) 100-12.5 MG tablet Take 1 tablet by mouth daily.     sildenafil (REVATIO) 20 MG tablet Take 20 mg by mouth as needed. (Patient not taking: Reported on 04/10/2022)     No current facility-administered medications for this visit.    Allergies  Allergen Reactions   Contrast Media [Iodinated Contrast Media] Anaphylaxis    Social History   Socioeconomic History   Marital status: Married    Spouse name: Not on file   Number of children: Not on file   Years of education: Not on file   Highest education level: Not on file  Occupational History   Occupation: Textiles  Tobacco Use   Smoking status: Never   Smokeless tobacco: Never  Vaping Use   Vaping Use: Never used  Substance and Sexual Activity   Alcohol use: Yes    Comment: rare   Drug use: Not Currently   Sexual activity: Not on file  Other Topics Concern   Not on file  Social History Narrative   Not on file   Social Determinants of Health  Financial Resource Strain: Not on file  Food Insecurity: Not on file  Transportation Needs: Not on file  Physical Activity: Not on file  Stress: Not on file  Social Connections: Not on file  Intimate Partner Violence: Not on file    Family History  Problem Relation Age of Onset   Lymphoma Mother    Cancer Father 29       Melanoma    Review of Systems:  As stated in the HPI and otherwise negative.   BP 108/70   Pulse 81   Ht 6' 2"$  (1.88 m)   Wt 134.3 kg   SpO2 98%   BMI 38.00 kg/m   Physical Examination: General: Well developed, well nourished, NAD  HEENT: OP clear, mucus membranes moist  SKIN: warm, dry. No rashes. Neuro: No focal deficits  Musculoskeletal: Muscle strength 5/5 all ext  Psychiatric:  Mood and affect normal  Neck: No JVD, no carotid bruits, no thyromegaly, no lymphadenopathy.  Lungs:Clear bilaterally, no wheezes, rhonci, crackles Cardiovascular: Regular rate and rhythm. No murmurs, gallops or rubs. Abdomen:Soft. Bowel sounds present. Non-tender.  Extremities: No lower extremity edema. Pulses are 2 + in the bilateral DP/PT.,ex  EKG:  EKG is ordered today. The ekg ordered today demonstrates NSR, LAFB  Recent Labs: No results found for requested labs within last 365 days.   Lipid Panel No results found for: "CHOL", "TRIG", "HDL", "CHOLHDL", "VLDL", "LDLCALC", "LDLDIRECT"   Wt Readings from Last 3 Encounters:  04/10/22 134.3 kg  09/16/21 128.7 kg  03/10/21 126.6 kg    Assessment and Plan:   1. SVT: No recent palpitations.   2. Thoracic aortic aneurysm: Followed in CT surgery office. Stable at 4.0 cm chest CT in July 2023.   3. HTN: BP is controlled. No changes.   Labs/ tests ordered today include:   Orders Placed This Encounter  Procedures   EKG 12-Lead   Disposition:   F/U with me in one year.   Signed, Lauree Chandler, MD 04/10/2022 9:09 AM    [facility] [place], [place], [place]  [address] Phone: [phone]; Fax: [phone]

## 2022-04-10 ENCOUNTER — Ambulatory Visit: Payer: Medicare HMO | Attending: Cardiovascular Disease | Admitting: Cardiovascular Disease

## 2022-04-10 ENCOUNTER — Encounter: Payer: Self-pay | Admitting: Cardiovascular Disease

## 2022-04-10 VITALS — BP 108/70 | HR 81 | Ht 74.0 in | Wt 296.0 lb

## 2022-04-10 DIAGNOSIS — I471 Supraventricular tachycardia, unspecified: Secondary | ICD-10-CM

## 2022-04-10 DIAGNOSIS — I10 Essential (primary) hypertension: Secondary | ICD-10-CM

## 2022-04-10 NOTE — Patient Instructions (Signed)
Medication Instructions:  No changes *If you need a refill on your cardiac medications before your next appointment, please call your pharmacy*   Lab Work: none If you have labs (blood work) drawn today and your tests are completely normal, you will receive your results only by: MyChart Message (if you have MyChart) OR A paper copy in the mail If you have any lab test that is abnormal or we need to change your treatment, we will call you to review the results.   Testing/Procedures: none   Follow-Up: At [facility], you and your health needs are our priority.  As part of our continuing mission to provide you with exceptional heart care, we have created designated Provider Care Teams.  These Care Teams include your primary Cardiologist (physician) and Advanced Practice Providers (APPs -  Physician Assistants and Nurse Practitioners) who all work together to provide you with the care you need, when you need it.   Your next appointment:   12 month(s)  Provider:   Christopher McAlhany, MD      

## 2022-04-15 DIAGNOSIS — M25651 Stiffness of right hip, not elsewhere classified: Secondary | ICD-10-CM | POA: Diagnosis not present

## 2022-04-15 DIAGNOSIS — R262 Difficulty in walking, not elsewhere classified: Secondary | ICD-10-CM | POA: Diagnosis not present

## 2022-04-15 DIAGNOSIS — M25551 Pain in right hip: Secondary | ICD-10-CM | POA: Diagnosis not present

## 2022-04-15 DIAGNOSIS — Z471 Aftercare following joint replacement surgery: Secondary | ICD-10-CM | POA: Diagnosis not present

## 2022-04-22 ENCOUNTER — Ambulatory Visit (HOSPITAL_BASED_OUTPATIENT_CLINIC_OR_DEPARTMENT_OTHER): Payer: Self-pay | Admitting: Orthopaedic Surgery

## 2022-04-22 ENCOUNTER — Ambulatory Visit (INDEPENDENT_AMBULATORY_CARE_PROVIDER_SITE_OTHER): Payer: Medicare HMO

## 2022-04-22 ENCOUNTER — Ambulatory Visit (HOSPITAL_BASED_OUTPATIENT_CLINIC_OR_DEPARTMENT_OTHER): Payer: Medicare HMO | Admitting: Orthopaedic Surgery

## 2022-04-22 DIAGNOSIS — M25512 Pain in left shoulder: Secondary | ICD-10-CM

## 2022-04-22 DIAGNOSIS — G8929 Other chronic pain: Secondary | ICD-10-CM

## 2022-04-22 DIAGNOSIS — M19012 Primary osteoarthritis, left shoulder: Secondary | ICD-10-CM

## 2022-04-22 DIAGNOSIS — Z471 Aftercare following joint replacement surgery: Secondary | ICD-10-CM | POA: Diagnosis not present

## 2022-04-22 DIAGNOSIS — M25551 Pain in right hip: Secondary | ICD-10-CM | POA: Diagnosis not present

## 2022-04-22 DIAGNOSIS — M25651 Stiffness of right hip, not elsewhere classified: Secondary | ICD-10-CM | POA: Diagnosis not present

## 2022-04-22 DIAGNOSIS — R262 Difficulty in walking, not elsewhere classified: Secondary | ICD-10-CM | POA: Diagnosis not present

## 2022-04-22 NOTE — Progress Notes (Signed)
Chief Complaint: Left shoulder pain     History of Present Illness:    Douglas Carney is a 72 y.o. male presents with ongoing multiple years of left shoulder pain which has been significantly worse this last year.  He does work in [employer] here in [place] and this has been limiting his ability to work quite significantly.  Does have a history of issues with the right shoulder previously managed by Dr. Johna Roles in [place].  He is status post right proximal humerus fracture treatment.  Overall his left side is his nondominant side but has been quite symptomatic compared to the right.  He has not previously had any injections.  He has been working in physical therapy for the left shoulder with limited relief.  He is here today for further assessment    Surgical History:   None  PMH/PSH/Family History/Social History/Meds/Allergies:    Past Medical History:  Diagnosis Date   Arthritis    right hip   BPH (benign prostatic hyperplasia)    Cancer ([place]) 2005   melanoma removed right shoulder   COVID 1-18-2-22   fever chills body aches and cough x 5 days all symptoms resolved   Family history of adverse reaction to anesthesia    mother had brain fog after anesthesia lasted 4 to 5 dsys   Hypertension    Syncope    Wears glasses    Past Surgical History:  Procedure Laterality Date   APPENDECTOMY     right shoulder fracture surgery  07/14/2003   right shoulder pins removed  2027   wires and screws also removed   TONSILLECTOMY  age 89   and adenoids removed   TRANSURETHRAL RESECTION OF PROSTATE N/A 04/12/2020   Procedure: TRANSURETHRAL RESECTION OF THE PROSTATE (TURP);  Surgeon: Irine Seal, MD;  Location: [facility];  Service: Urology;  Laterality: N/A;   Social History   Socioeconomic History   Marital status: Widowed    Spouse name: Not on file   Number of children: Not on file   Years of education: Not on file   Highest  education level: Not on file  Occupational History   Occupation: Textiles  Tobacco Use   Smoking status: Never   Smokeless tobacco: Never  Vaping Use   Vaping Use: Never used  Substance and Sexual Activity   Alcohol use: Yes    Comment: rare   Drug use: Not Currently   Sexual activity: Not on file  Other Topics Concern   Not on file  Social History Narrative   Not on file   Social Determinants of Health   Financial Resource Strain: Not on file  Food Insecurity: Not on file  Transportation Needs: Not on file  Physical Activity: Not on file  Stress: Not on file  Social Connections: Not on file   Family History  Problem Relation Age of Onset   Lymphoma Mother    Cancer Father 48       Melanoma   Allergies  Allergen Reactions   Contrast Media [Iodinated Contrast Media] Anaphylaxis   Current Outpatient Medications  Medication Sig Dispense Refill   fluorouracil (EFUDEX) 5 % cream Apply topically 2 (two) times daily. For sun spot areas to both ears and right temple     losartan-hydrochlorothiazide (HYZAAR) 100-12.5 MG tablet Take 1  tablet by mouth daily.     sildenafil (REVATIO) 20 MG tablet Take 20 mg by mouth as needed. (Patient not taking: Reported on 04/10/2022)     No current facility-administered medications for this visit.   No results found.  Review of Systems:   A ROS was performed including pertinent positives and negatives as documented in the HPI.  Physical Exam :   Constitutional: NAD and appears stated age Neurological: Alert and oriented Psych: Appropriate affect and cooperative There were no vitals taken for this visit.   Comprehensive Musculoskeletal Exam:    Musculoskeletal Exam    Inspection Right Left  Skin No atrophy or winging No atrophy or winging  Palpation    Tenderness None Left glenohumeral  Range of Motion    Flexion (passive) 170 90  Flexion (active) 170 70  Abduction 170 70  ER at the side 70 10  Can reach behind back to T12  Back pocket  Strength     none Limited due to pain  Special Tests    Pseudoparalytic No No  Neurologic    Fires PIN, radial, median, ulnar, musculocutaneous, axillary, suprascapular, long thoracic, and spinal accessory innervated muscles. No abnormal sensibility  Vascular/Lymphatic    Radial Pulse 2+ 2+  Cervical Exam    Patient has symmetric cervical range of motion with negative Spurling's test.  Special Test:      Imaging:   Xray (3 views left shoulder): Advanced glenohumeral osteoarthritis    I personally reviewed and interpreted the radiographs.   Assessment:   72 y.o. male with left shoulder advanced glenohumeral osteoarthritis.  This time he has trialed physical therapy without any relief.  I did discuss that we could perform a shoulder injection although I do believe that this would likely only give a couple months of transient relief.  He does have a very prominent crepitus and pain with overhead motion.  This is limiting his ability to do his job working in [place].  Side effect we did discuss the role for left shoulder arthroplasty.  Specifically I did discuss that I would recommend reverse shoulder arthroplasty at this time.  I discussed the specific limitations and restrictions associated with this.  After long discussion he is elected for left shoulder arthroplasty  Plan :    -Plan for left shoulder reverse shoulder arthroplasty    After a lengthy discussion of treatment options, including risks, benefits, alternatives, complications of surgical and nonsurgical conservative options, the patient elected surgical repair.   The patient  is aware of the material risks  and complications including, but not limited to injury to adjacent structures, neurovascular injury, infection, numbness, bleeding, implant failure, thermal burns, stiffness, persistent pain, failure to heal, disease transmission from allograft, need for further surgery, dislocation, anesthetic risks,  blood clots, risks of death,and others. The probabilities of surgical success and failure discussed with patient given their particular co-morbidities.The time and nature of expected rehabilitation and recovery was discussed.The patient's questions were all answered preoperatively.  No barriers to understanding were noted. I explained the natural history of the disease process and Rx rationale.  I explained to the patient what I considered to be reasonable expectations given their personal situation.  The final treatment plan was arrived at through a shared patient decision making process model.      I personally saw and evaluated the patient, and participated in the management and treatment plan.  Vanetta Mulders, MD Attending Physician, Orthopedic Surgery  This document was dictated using Dragon  voice recognition software. A reasonable attempt at proof reading has been made to minimize errors.

## 2022-04-24 DIAGNOSIS — M25551 Pain in right hip: Secondary | ICD-10-CM | POA: Diagnosis not present

## 2022-04-24 DIAGNOSIS — Z471 Aftercare following joint replacement surgery: Secondary | ICD-10-CM | POA: Diagnosis not present

## 2022-04-24 DIAGNOSIS — R262 Difficulty in walking, not elsewhere classified: Secondary | ICD-10-CM | POA: Diagnosis not present

## 2022-04-24 DIAGNOSIS — M25651 Stiffness of right hip, not elsewhere classified: Secondary | ICD-10-CM | POA: Diagnosis not present

## 2022-05-24 NOTE — Progress Notes (Unsigned)
326/24- 72 yoM never smoker for sleep evaluation courtesy of Dr Shelia Media with concern of OSA Medical problem list includes Thoracic Aortic Aneurysm, HTN, Arthritis, BPH, Hyperlipidemia, Epworth score-5 Body weight today-300 lbs -----No sleep study previously. Been told he snores loudly, there have been witnessed episodes of apneas He doesn't recognize problem daytime sleepiness, but falls asleep easily when he tries No sleep med. 3-4 cups AM coffee.  ENT+ tonsils and adenoids. Mild SVT. Breathes ok through nose. Denies lung problems. 50 lb weight gain in past year or so.   Prior to Admission medications   Medication Sig Start Date End Date Taking? Authorizing Provider  celecoxib (CELEBREX) 200 MG capsule Take 200 mg by mouth daily. 05/03/22  Yes [provider]  fluorouracil (EFUDEX) 5 % cream Apply topically 2 (two) times daily. For sun spot areas to both ears and right temple   Yes [provider]  losartan-hydrochlorothiazide (HYZAAR) 100-12.5 MG tablet Take 1 tablet by mouth daily. 08/25/21  Yes [provider]  sildenafil (REVATIO) 20 MG tablet Take 20 mg by mouth as needed.    [provider]   ,pmh Past Surgical History:  Procedure Laterality Date   APPENDECTOMY     right shoulder fracture surgery  07/14/2003   right shoulder pins removed  2027   wires and screws also removed   TONSILLECTOMY  age 34   and adenoids removed   TRANSURETHRAL RESECTION OF PROSTATE N/A 04/12/2020   Procedure: TRANSURETHRAL RESECTION OF THE PROSTATE (TURP);  Surgeon: Irine Seal, MD;  Location: [facility];  Service: Urology;  Laterality: N/A;   Family History  Problem Relation Age of Onset   Lymphoma Mother    Cancer Father 32       Melanoma   Social History   Socioeconomic History   Marital status: Widowed    Spouse name: Not on file   Number of children: Not on file   Years of education: Not on file   Highest education level: Not on file   Occupational History   Occupation: Textiles  Tobacco Use   Smoking status: Never   Smokeless tobacco: Never  Vaping Use   Vaping Use: Never used  Substance and Sexual Activity   Alcohol use: Yes    Comment: rare   Drug use: Not Currently   Sexual activity: Not on file  Other Topics Concern   Not on file  Social History Narrative   Not on file   Social Determinants of Health   Financial Resource Strain: Not on file  Food Insecurity: Not on file  Transportation Needs: Not on file  Physical Activity: Not on file  Stress: Not on file  Social Connections: Not on file  Intimate Partner Violence: Not on file   ROS-see HPI   Negative unless "+" Constitutional:    weight loss, night sweats, fevers, chills, fatigue, lassitude. HEENT:    headaches, difficulty swallowing, tooth/dental problems, sore throat,       sneezing, itching, ear ache, nasal congestion, post nasal drip, snoring CV:    chest pain, orthopnea, PND, +swelling in lower extremities, anasarca,                                   dizziness, palpitations Resp:   shortness of breath with exertion or at rest.                productive cough,  non-productive cough, coughing up of blood.              change in color of mucus.  wheezing.   Skin:    rash or lesions. GI:  No-   heartburn, indigestion, abdominal pain, nausea, vomiting, diarrhea,                 change in bowel habits, loss of appetite GU: dysuria, change in color of urine, no urgency or frequency.   flank pain. MS:   joint pain, +stiffness, decreased range of motion, back pain. Neuro-     nothing unusual Psych:  change in mood or affect.  depression or anxiety.   memory loss.   OBJ- Physical Exam General- Alert, Oriented, Affect-appropriate, Distress- none acute, + obese Skin- rash-none, lesions- none, excoriation- none Lymphadenopathy- none Head- atraumatic            Eyes- Gross vision intact, PERRLA, conjunctivae and secretions clear            Ears-  Hearing, canals-normal            Nose- Clear, no-Septal dev, mucus, polyps, erosion, perforation             Throat- Mallampati II , mucosa clear , drainage- none, tonsils absent, + teeth Neck- flexible , trachea midline, no stridor , thyroid nl, carotid no bruit Chest - symmetrical excursion , unlabored           Heart/CV- RRR , no murmur , no gallop  , no rub, nl s1 s2                           - JVD- none , edema- none, stasis changes- none, varices- none           Lung- clear to P&A, wheeze- none, cough- none , dullness-none, rub- none           Chest wall-  Abd-  Br/ Gen/ Rectal- Not done, not indicated Extrem- cyanosis- none, clubbing, none, atrophy- none, strength- nl Neuro- grossly intact to observation

## 2022-05-25 DIAGNOSIS — M25512 Pain in left shoulder: Secondary | ICD-10-CM | POA: Diagnosis not present

## 2022-05-25 DIAGNOSIS — M531 Cervicobrachial syndrome: Secondary | ICD-10-CM | POA: Diagnosis not present

## 2022-05-25 DIAGNOSIS — M25612 Stiffness of left shoulder, not elsewhere classified: Secondary | ICD-10-CM | POA: Diagnosis not present

## 2022-05-25 DIAGNOSIS — M75112 Incomplete rotator cuff tear or rupture of left shoulder, not specified as traumatic: Secondary | ICD-10-CM | POA: Diagnosis not present

## 2022-05-26 ENCOUNTER — Encounter: Payer: Self-pay | Admitting: Internal Medicine

## 2022-05-26 ENCOUNTER — Ambulatory Visit: Payer: Medicare HMO | Admitting: Internal Medicine

## 2022-05-26 VITALS — BP 144/88 | HR 74 | Ht 74.0 in | Wt 300.0 lb

## 2022-05-26 DIAGNOSIS — R0683 Snoring: Secondary | ICD-10-CM | POA: Diagnosis not present

## 2022-05-26 NOTE — Assessment & Plan Note (Signed)
Significant weight gain in past year, likely reflects psychosocial issues. He acknowledges need to turn this around.

## 2022-05-26 NOTE — Assessment & Plan Note (Signed)
High probability for OSA Plan- sleep study

## 2022-05-26 NOTE — Patient Instructions (Signed)
Order- schedule HST   dx OSA  Please call us about 2 weeks after your test for results and recommendations

## 2022-06-02 DIAGNOSIS — Z125 Encounter for screening for malignant neoplasm of prostate: Secondary | ICD-10-CM | POA: Diagnosis not present

## 2022-06-02 DIAGNOSIS — I10 Essential (primary) hypertension: Secondary | ICD-10-CM | POA: Diagnosis not present

## 2022-06-05 DIAGNOSIS — Z23 Encounter for immunization: Secondary | ICD-10-CM | POA: Diagnosis not present

## 2022-06-05 DIAGNOSIS — Z0000 Encounter for general adult medical examination without abnormal findings: Secondary | ICD-10-CM | POA: Diagnosis not present

## 2022-06-05 DIAGNOSIS — I10 Essential (primary) hypertension: Secondary | ICD-10-CM | POA: Diagnosis not present

## 2022-06-05 DIAGNOSIS — E785 Hyperlipidemia, unspecified: Secondary | ICD-10-CM | POA: Diagnosis not present

## 2022-07-01 ENCOUNTER — Encounter (HOSPITAL_BASED_OUTPATIENT_CLINIC_OR_DEPARTMENT_OTHER): Payer: Self-pay | Admitting: Orthopaedic Surgery

## 2022-07-02 NOTE — Telephone Encounter (Signed)
Scheduled for tomorrow with Samuel Jester

## 2022-07-03 ENCOUNTER — Ambulatory Visit (HOSPITAL_BASED_OUTPATIENT_CLINIC_OR_DEPARTMENT_OTHER): Payer: Medicare HMO | Admitting: Student

## 2022-07-03 ENCOUNTER — Encounter (HOSPITAL_BASED_OUTPATIENT_CLINIC_OR_DEPARTMENT_OTHER): Payer: Self-pay | Admitting: Student

## 2022-07-03 DIAGNOSIS — M19012 Primary osteoarthritis, left shoulder: Secondary | ICD-10-CM | POA: Diagnosis not present

## 2022-07-03 DIAGNOSIS — M25512 Pain in left shoulder: Secondary | ICD-10-CM

## 2022-07-03 MED ORDER — TRIAMCINOLONE ACETONIDE 40 MG/ML IJ SUSP
2.0000 mL | INTRAMUSCULAR | Status: AC | PRN
  Administered 2022-07-03: 2 mL via INTRA_ARTICULAR

## 2022-07-03 MED ORDER — LIDOCAINE HCL 1 % IJ SOLN
4.0000 mL | INTRAMUSCULAR | Status: AC | PRN
  Administered 2022-07-03: 4 mL

## 2022-07-03 NOTE — Progress Notes (Signed)
Chief Complaint: Left shoulder pain     History of Present Illness:    Courtney Reifsnyder is a 72 y.o. male presents today for left shoulder injection.  He has known osteoarthritis however he is trying to hold off on the reverse shoulder replacement until the end of this year.  He states that his pain levels have been moderate to severe and is looking for a way to find relief.  His limited range of motion and pain are making it more difficult to do his job.   Surgical History:   None  PMH/PSH/Family History/Social History/Meds/Allergies:    Past Medical History:  Diagnosis Date   Arthritis    right hip   BPH (benign prostatic hyperplasia)    Cancer ([facility]) 2005   melanoma removed right shoulder   COVID 1-18-2-22   fever chills body aches and cough x 5 days all symptoms resolved   Family history of adverse reaction to anesthesia    mother had brain fog after anesthesia lasted 4 to 5 dsys   Hypertension    Syncope    Wears glasses    Past Surgical History:  Procedure Laterality Date   APPENDECTOMY     right shoulder fracture surgery  07/14/2003   right shoulder pins removed  2027   wires and screws also removed   TONSILLECTOMY  age 88   and adenoids removed   TRANSURETHRAL RESECTION OF PROSTATE N/A 04/12/2020   Procedure: TRANSURETHRAL RESECTION OF THE PROSTATE (TURP);  Surgeon: Bjorn Pippin, MD;  Location: [facility];  Service: Urology;  Laterality: N/A;   Social History   Socioeconomic History   Marital status: Widowed    Spouse name: Not on file   Number of children: Not on file   Years of education: Not on file   Highest education level: Not on file  Occupational History   Occupation: Textiles  Tobacco Use   Smoking status: Never   Smokeless tobacco: Never  Vaping Use   Vaping Use: Never used  Substance and Sexual Activity   Alcohol use: Yes    Comment: rare   Drug use: Not Currently   Sexual activity: Not on  file  Other Topics Concern   Not on file  Social History Narrative   Not on file   Social Determinants of Health   Financial Resource Strain: Not on file  Food Insecurity: Not on file  Transportation Needs: Not on file  Physical Activity: Not on file  Stress: Not on file  Social Connections: Not on file   Family History  Problem Relation Age of Onset   Lymphoma Mother    Cancer Father 31       Melanoma   Allergies  Allergen Reactions   Contrast Media [Iodinated Contrast Media] Anaphylaxis   Current Outpatient Medications  Medication Sig Dispense Refill   celecoxib (CELEBREX) 200 MG capsule Take 200 mg by mouth daily.     fluorouracil (EFUDEX) 5 % cream Apply topically 2 (two) times daily. For sun spot areas to both ears and right temple     losartan-hydrochlorothiazide (HYZAAR) 100-12.5 MG tablet Take 1 tablet by mouth daily.     No current facility-administered medications for this visit.   No results found.  Review of Systems:   A ROS was performed including pertinent positives  and negatives as documented in the HPI.  Physical Exam :   Constitutional: NAD and appears stated age Neurological: Alert and oriented Psych: Appropriate affect and cooperative There were no vitals taken for this visit.   Comprehensive Musculoskeletal Exam:    Tenderness to palpation throughout the left glenohumeral joint.  His active range of motion is to 70 degrees forward flexion 20 degrees external rotation and internal rotation to back pocket.  There is some crepitus noted in the shoulder.  Distal neurosensory exam intact.   Imaging:    I personally reviewed and interpreted the radiographs.   Assessment:   72 y.o. male with significant left shoulder osteoarthritis.  He is a candidate for reverse shoulder replacement and would like to pursue this option likely in November or December of this year.  He would like to receive a cortisone injection today for pain relief.   Ultrasound-guided injection was performed of the left shoulder today without complication.  We will plan to follow-up with patient as needed to assess relief with the injection.  Plan :    -Return to clinic as needed      Procedure Note  Patient: Douglas Carney             Date of Birth: Apr 01, 1950           MRN: [id number]             Visit Date: 07/03/2022  Procedures: Visit Diagnoses:  1. Osteoarthritis of left shoulder, unspecified osteoarthritis type     Large Joint Inj: L glenohumeral on 07/03/2022 2:10 PM Indications: pain Details: 22 G 1.5 in needle, ultrasound-guided anterior approach Medications: 4 mL lidocaine 1 %; 2 mL triamcinolone acetonide 40 MG/ML Outcome: tolerated well, no immediate complications Consent was given by the patient. Patient was prepped and draped in the usual sterile fashion.     I personally saw and evaluated the patient, and participated in the management and treatment plan.  Hazle Nordmann, PA-C Orthopedics  This document was dictated using Conservation officer, historic buildings. A reasonable attempt at proof reading has been made to minimize errors.

## 2022-07-20 ENCOUNTER — Encounter: Payer: Self-pay | Admitting: Internal Medicine

## 2022-07-20 NOTE — Telephone Encounter (Signed)
Mychart message sent by pt:  Douglas Carney  P [facility] (supporting Waymon Budge, MD)30 minutes ago (9:26 AM)   I received my sleep apnea test from [employer] on May 3. I tried to use it on Saturday, May 4 but received a message "OX Failure". I called the 24 hour help line and the customer service rep could not help me but promised [employer] would call me back on Sunday. [employer] called an old home line instead of my cell phone [phone]). I called them again on Monday, May 6. I told them about the error message and [employer] said to return it and they would send a new kit. [employer] thought the device must have been damaged in shipping. On Wednesday, May 15 [employer] called to tell me that the device had "no recording". Again I explained that I had called the help line twice after receiving an error message "OX Failure", was instructed to return the device and they would send a new one as soon as they received the old device. I was suppose to receive a text that the device was sent. It is now Monday, May 20. No text, no device. What should I do? [name]    Routing to [employer] for review. Please advise.

## 2022-07-21 DIAGNOSIS — I10 Essential (primary) hypertension: Secondary | ICD-10-CM | POA: Diagnosis not present

## 2022-07-21 DIAGNOSIS — R0989 Other specified symptoms and signs involving the circulatory and respiratory systems: Secondary | ICD-10-CM | POA: Diagnosis not present

## 2022-07-24 DIAGNOSIS — G4730 Sleep apnea, unspecified: Secondary | ICD-10-CM | POA: Diagnosis not present

## 2022-08-04 ENCOUNTER — Encounter: Payer: Self-pay | Admitting: Cardiovascular Disease

## 2022-08-10 ENCOUNTER — Encounter (INDEPENDENT_AMBULATORY_CARE_PROVIDER_SITE_OTHER): Payer: Medicare HMO

## 2022-08-10 DIAGNOSIS — R55 Syncope and collapse: Secondary | ICD-10-CM | POA: Diagnosis not present

## 2022-08-10 DIAGNOSIS — R002 Palpitations: Secondary | ICD-10-CM | POA: Diagnosis not present

## 2022-08-10 DIAGNOSIS — N401 Enlarged prostate with lower urinary tract symptoms: Secondary | ICD-10-CM | POA: Diagnosis not present

## 2022-08-10 DIAGNOSIS — R0683 Snoring: Secondary | ICD-10-CM

## 2022-08-10 DIAGNOSIS — I10 Essential (primary) hypertension: Secondary | ICD-10-CM | POA: Diagnosis not present

## 2022-08-10 DIAGNOSIS — G4733 Obstructive sleep apnea (adult) (pediatric): Secondary | ICD-10-CM

## 2022-08-14 ENCOUNTER — Other Ambulatory Visit: Payer: Self-pay | Admitting: *Deleted

## 2022-08-14 DIAGNOSIS — R0989 Other specified symptoms and signs involving the circulatory and respiratory systems: Secondary | ICD-10-CM

## 2022-08-17 DIAGNOSIS — N401 Enlarged prostate with lower urinary tract symptoms: Secondary | ICD-10-CM | POA: Diagnosis not present

## 2022-08-17 DIAGNOSIS — N5201 Erectile dysfunction due to arterial insufficiency: Secondary | ICD-10-CM | POA: Diagnosis not present

## 2022-08-17 DIAGNOSIS — R351 Nocturia: Secondary | ICD-10-CM | POA: Diagnosis not present

## 2022-08-17 DIAGNOSIS — R972 Elevated prostate specific antigen [PSA]: Secondary | ICD-10-CM | POA: Diagnosis not present

## 2022-08-17 NOTE — Telephone Encounter (Signed)
Home sleep test showed severe obstructive sleep apnea averaging 34 apneas per hour with low oxygen levels. I recommend we order new DME, new CPAP auto 5-20, mask of choice, supplies, humidifier, AirView/ card  He will need an appointment to see me in 31-90 days please

## 2022-08-20 ENCOUNTER — Encounter: Payer: Self-pay | Admitting: Internal Medicine

## 2022-08-20 DIAGNOSIS — G4733 Obstructive sleep apnea (adult) (pediatric): Secondary | ICD-10-CM

## 2022-08-24 ENCOUNTER — Ambulatory Visit (HOSPITAL_COMMUNITY)
Admission: RE | Admit: 2022-08-24 | Discharge: 2022-08-24 | Disposition: A | Discharge status: Home / Self Care | Payer: Medicare HMO | Source: Ambulatory Visit | Attending: Surgery | Admitting: Surgery

## 2022-08-24 DIAGNOSIS — R0989 Other specified symptoms and signs involving the circulatory and respiratory systems: Secondary | ICD-10-CM | POA: Diagnosis not present

## 2022-08-24 NOTE — Progress Notes (Unsigned)
[facility] [place]  ASSESSMENT / PLAN: 72 y.o. male with asymmetric blood pressures.  Today his blood pressures are fairly closely correlated.  Duplex ultrasound of the carotid, vertebral, and subclavian arteries show no evidence of significant subclavian stenosis bilaterally.  His clinical exam shows equally palpable brachial and radial pulses bilaterally.  I counseled him to follow a blood pressure that reads the highest in his arms.  He has no hemodynamically significant stenosis and I would not recommend intervention at this time.  He can follow-up with me on an as-needed basis.  CHIEF COMPLAINT: uneven blood pressure measurements  HISTORY OF PRESENT ILLNESS: Douglas Carney is a 72 y.o. male referred to clinic for evaluation of unequal blood pressure measurements in the upper arms bilaterally.  Patient reports no upper extremity claudication, rest pain, or ulceration.  He does not describe any vertebral basilar symptoms.  Past Medical History:  Diagnosis Date   Arthritis    right hip   BPH (benign prostatic hyperplasia)    Cancer ([facility]) 2005   melanoma removed right shoulder   COVID 1-18-2-22   fever chills body aches and cough x 5 days all symptoms resolved   Family history of adverse reaction to anesthesia    mother had brain fog after anesthesia lasted 4 to 5 dsys   Hypertension    Syncope    Wears glasses     Past Surgical History:  Procedure Laterality Date   APPENDECTOMY     right shoulder fracture surgery  07/14/2003   right shoulder pins removed  2027   wires and screws also removed   TONSILLECTOMY  age 71   and adenoids removed   TRANSURETHRAL RESECTION OF PROSTATE N/A 04/12/2020   Procedure: TRANSURETHRAL RESECTION OF THE PROSTATE (TURP);  Surgeon: Bjorn Pippin, MD;  Location: [facility];  Service: Urology;  Laterality: N/A;    Family History  Problem Relation Age of Onset   Lymphoma Mother    Cancer Father 31       Melanoma     Social History   Socioeconomic History   Marital status: Widowed    Spouse name: Not on file   Number of children: Not on file   Years of education: Not on file   Highest education level: Not on file  Occupational History   Occupation: Textiles  Tobacco Use   Smoking status: Never   Smokeless tobacco: Never  Vaping Use   Vaping Use: Never used  Substance and Sexual Activity   Alcohol use: Yes    Comment: rare   Drug use: Not Currently   Sexual activity: Not on file  Other Topics Concern   Not on file  Social History Narrative   Not on file   Social Determinants of Health   Financial Resource Strain: Not on file  Food Insecurity: Not on file  Transportation Needs: Not on file  Physical Activity: Not on file  Stress: Not on file  Social Connections: Not on file  Intimate Partner Violence: Not on file    Allergies  Allergen Reactions   Contrast Media [Iodinated Contrast Media] Anaphylaxis   Meloxicam Diarrhea    Other Reaction(s): Other (See Comments)  GI upset    Current Outpatient Medications  Medication Sig Dispense Refill   amLODipine (NORVASC) 5 MG tablet Take 5 mg by mouth daily.     fluorouracil (EFUDEX) 5 % cream Apply topically 2 (two) times daily. For sun spot areas to both ears and right  temple     olmesartan (BENICAR) 40 MG tablet Take 40 mg by mouth daily.     No current facility-administered medications for this visit.    PHYSICAL EXAM Vitals:   08/25/22 1008 08/25/22 1010  BP: 125/83 136/87  Pulse: 73   Resp: 20   Temp: 98.4 F (36.9 C)   SpO2: 93%   Weight: 290 lb (131.5 kg)   Height: 6\' 2"  (1.88 m)    Well-appearing elderly man in no distress Regular rate and rhythm  Unlabored breathing 2+ brachial pulses bilaterally 2+ radial pulses bilaterally   PERTINENT LABORATORY AND RADIOLOGIC DATA  Most recent CBC    Latest Ref Rng & Units 12/21/2020    7:20 PM 04/12/2020    6:47 AM  CBC  WBC 4.0 - 10.5 K/uL 11.7    Hemoglobin  13.0 - 17.0 g/dL 16.1  09.6   Hematocrit 39.0 - 52.0 % 45.9  44.0   Platelets 150 - 400 K/uL 146       Most recent CMP    Latest Ref Rng & Units 12/21/2020    7:20 PM 04/12/2020    6:47 AM  CMP  Glucose 70 - 99 mg/dL 89  045   BUN 8 - 23 mg/dL 16  19   Creatinine 4.09 - 1.24 mg/dL 8.11  9.14   Sodium 782 - 145 mmol/L 137  142   Potassium 3.5 - 5.1 mmol/L 3.8  3.7   Chloride 98 - 111 mmol/L 102  106   CO2 22 - 32 mmol/L 23    Calcium 8.9 - 10.3 mg/dL 9.5    Total Protein 6.5 - 8.1 g/dL 6.7    Total Bilirubin 0.3 - 1.2 mg/dL 0.6    Alkaline Phos 38 - 126 U/L 71    AST 15 - 41 U/L 17    ALT 0 - 44 U/L 26     Right Brachial triphasic waveform 67 cm/s Left Brachial triphasic waveform  76 cm/s      Summary:  Right Carotid: There is no evidence of stenosis in the right ICA.   Left Carotid: There is no evidence of stenosis in the left ICA.   Vertebrals:  Bilateral vertebral arteries demonstrate antegrade flow.  Subclavians: Normal flow hemodynamics were seen in bilateral subclavian               arteries.    Rande Brunt. Lenell Antu, MD [facility] Phone Number: [phone] 08/25/2022 4:21 PM   Total time spent on preparing this encounter including chart review, data review, collecting history, examining the patient, coordinating care for this new patient, 45 minutes.  Portions of this report may have been transcribed using voice recognition software.  Every effort has been made to ensure accuracy; however, inadvertent computerized transcription errors may still be present.

## 2022-08-25 ENCOUNTER — Encounter: Payer: Self-pay | Admitting: Vascular Surgery

## 2022-08-25 ENCOUNTER — Ambulatory Visit: Payer: Medicare HMO | Admitting: Vascular Surgery

## 2022-08-25 VITALS — BP 136/87 | HR 73 | Temp 98.4°F | Resp 20 | Ht 74.0 in | Wt 290.0 lb

## 2022-08-25 DIAGNOSIS — R6889 Other general symptoms and signs: Secondary | ICD-10-CM | POA: Diagnosis not present

## 2022-08-28 DIAGNOSIS — Z96641 Presence of right artificial hip joint: Secondary | ICD-10-CM | POA: Diagnosis not present

## 2022-08-28 DIAGNOSIS — M1611 Unilateral primary osteoarthritis, right hip: Secondary | ICD-10-CM | POA: Diagnosis not present

## 2022-09-29 DIAGNOSIS — M19012 Primary osteoarthritis, left shoulder: Secondary | ICD-10-CM | POA: Diagnosis not present

## 2022-09-29 DIAGNOSIS — M25512 Pain in left shoulder: Secondary | ICD-10-CM | POA: Diagnosis not present

## 2022-10-01 ENCOUNTER — Encounter (HOSPITAL_BASED_OUTPATIENT_CLINIC_OR_DEPARTMENT_OTHER): Payer: Self-pay | Admitting: Orthopaedic Surgery

## 2022-10-05 ENCOUNTER — Ambulatory Visit (HOSPITAL_BASED_OUTPATIENT_CLINIC_OR_DEPARTMENT_OTHER): Payer: Medicare HMO | Admitting: Orthopaedic Surgery

## 2022-10-05 DIAGNOSIS — M19012 Primary osteoarthritis, left shoulder: Secondary | ICD-10-CM | POA: Diagnosis not present

## 2022-10-05 MED ORDER — TRIAMCINOLONE ACETONIDE 40 MG/ML IJ SUSP
80.0000 mg | INTRAMUSCULAR | Status: AC | PRN
Start: 2022-10-05 — End: 2022-10-05
  Administered 2022-10-05: 80 mg via INTRA_ARTICULAR

## 2022-10-05 MED ORDER — LIDOCAINE HCL 1 % IJ SOLN
4.0000 mL | INTRAMUSCULAR | Status: AC | PRN
Start: 2022-10-05 — End: 2022-10-05
  Administered 2022-10-05: 4 mL

## 2022-10-05 NOTE — Progress Notes (Signed)
Chief Complaint: Left shoulder pain     History of Present Illness:   10/05/2022: Presents today for left shoulder injection.  He is planning on having surgery in December or January of this year.  Douglas Carney is a 72 y.o. male presents with ongoing multiple years of left shoulder pain which has been significantly worse this last year.  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  Does have a history of issues with the right shoulder previously managed by Dr. Carney Living in [place].  He is status post right proximal humerus fracture treatment.  Overall his left side is his nondominant side but has been quite symptomatic compared to the right.  He has not previously had any injections.  He has been working in physical therapy for the left shoulder with limited relief.  He is here today for further assessment    Surgical History:   None  PMH/PSH/Family History/Social History/Meds/Allergies:    Past Medical History:  Diagnosis Date  . Arthritis    right hip  . BPH (benign prostatic hyperplasia)   . Cancer (HCC) 2005   melanoma removed right shoulder  . COVID 1-18-2-22   fever chills body aches and cough x 5 days all symptoms resolved  . Family history of adverse reaction to anesthesia    mother had brain fog after anesthesia lasted 4 to 5 dsys  . Hypertension   . Syncope   . Wears glasses    Past Surgical History:  Procedure Laterality Date  . APPENDECTOMY    . right shoulder fracture surgery  07/14/2003  . right shoulder pins removed  2027   wires and screws also removed  . TONSILLECTOMY  age 52   and adenoids removed  . TRANSURETHRAL RESECTION OF PROSTATE N/A 04/12/2020   Procedure: TRANSURETHRAL RESECTION OF THE PROSTATE (TURP);  Surgeon: Bjorn Pippin, MD;  Location: [facility];  Service: Urology;  Laterality: N/A;   Social History   Socioeconomic History  .  Marital status: Widowed    Spouse name: Not on file  . Number of children: Not on file  . Years of education: Not on file  . Highest education level: Not on file  Occupational History  . Occupation: Textiles  Tobacco Use  . Smoking status: Never  . Smokeless tobacco: Never  Vaping Use  . Vaping status: Never Used  Substance and Sexual Activity  . Alcohol use: Yes    Comment: rare  . Drug use: Not Currently  . Sexual activity: Not on file  Other Topics Concern  . Not on file  Social History Narrative  . Not on file   Social Determinants of Health   Financial Resource Strain: Low Risk  (07/20/2022)   [facility], [facility]   Overall Financial Resource Strain (CARDIA)   . Difficulty of Paying Living Expenses: Not hard at all  Food Insecurity: No Food Insecurity (07/20/2022)   [facility], [facility]   Hunger Vital Sign   . Worried About Programme researcher, broadcasting/film/video in the Last Year: Never true   . Ran Out of Food in the Last Year: Never true  Transportation Needs: No Transportation Needs (07/20/2022)  [facility]   [facility] - Transportation   . In the past 12 months, has lack of transportation kept you from medical appointments or from getting medications?: No   . Lack of Transportation (Non-Medical): No  Physical Activity: Not on file  Stress: Not on file  Social Connections: Not on file   Family History  Problem Relation Age of Onset  . Lymphoma Mother   . Cancer Father 56       Melanoma   Allergies  Allergen Reactions  . Contrast Media [Iodinated Contrast Media] Anaphylaxis  . Meloxicam Diarrhea    Other Reaction(s): Other (See Comments)  GI upset   Current Outpatient Medications  Medication Sig Dispense Refill  . amLODipine (NORVASC) 5 MG tablet Take 5 mg by mouth daily.    . fluorouracil (EFUDEX) 5 %  cream Apply topically 2 (two) times daily. For sun spot areas to both ears and right temple    . olmesartan (BENICAR) 40 MG tablet Take 40 mg by mouth daily.     No current facility-administered medications for this visit.   No results found.  Review of Systems:   A ROS was performed including pertinent positives and negatives as documented in the HPI.  Physical Exam :   Constitutional: NAD and appears stated age Neurological: Alert and oriented Psych: Appropriate affect and cooperative There were no vitals taken for this visit.   Comprehensive Musculoskeletal Exam:    Musculoskeletal Exam    Inspection Right Left  Skin No atrophy or winging No atrophy or winging  Palpation    Tenderness None Left glenohumeral  Range of Motion    Flexion (passive) 170 90  Flexion (active) 170 70  Abduction 170 70  ER at the side 70 10  Can reach behind back to T12 Back pocket  Strength     none Limited due to pain  Special Tests    Pseudoparalytic No No  Neurologic    Fires PIN, radial, median, ulnar, musculocutaneous, axillary, suprascapular, long thoracic, and spinal accessory innervated muscles. No abnormal sensibility  Vascular/Lymphatic    Radial Pulse 2+ 2+  Cervical Exam    Patient has symmetric cervical range of motion with negative Spurling's test.  Special Test:      Imaging:   Xray (3 views left shoulder): Advanced glenohumeral osteoarthritis    I personally reviewed and interpreted the radiographs.   Assessment:   72 y.o. male with left shoulder advanced glenohumeral osteoarthritis.  This time he has trialed physical therapy without any relief.  I did discuss that we could perform a shoulder injection although I do believe that this would likely only give a couple months of transient relief.  He does have a very prominent crepitus and pain with overhead motion.  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  Side effect we did discuss the role for  left shoulder arthroplasty.  Specifically I did discuss that I would recommend reverse shoulder arthroplasty at this time.  I discussed the specific limitations and restrictions associated with this.  After long discussion he is elected for left shoulder arthroplasty  Plan :    -Plan for left shoulder reverse shoulder arthroplasty   Left ultrasound-guided injection performed with verbal consent obtained    Procedure Note  Patient: Douglas Carney             Date of Birth: [birth year]  MRN: [id number]             Visit Date: 10/05/2022  Procedures: Visit Diagnoses: No diagnosis found.  Large Joint Inj: L glenohumeral on 10/05/2022 11:26 AM Indications: pain Details: 22 G 1.5 in needle, ultrasound-guided anterior approach  Arthrogram: No  Medications: 4 mL lidocaine 1 %; 80 mg triamcinolone acetonide 40 MG/ML Outcome: tolerated well, no immediate complications Procedure, treatment alternatives, risks and benefits explained, specific risks discussed. Consent was given by the patient. Immediately prior to procedure a time out was called to verify the correct patient, procedure, equipment, support staff and site/side marked as required. Patient was prepped and draped in the usual sterile fashion.        After a lengthy discussion of treatment options, including risks, benefits, alternatives, complications of surgical and nonsurgical conservative options, the patient elected surgical repair.   The patient  is aware of the material risks  and complications including, but not limited to injury to adjacent structures, neurovascular injury, infection, numbness, bleeding, implant failure, thermal burns, stiffness, persistent pain, failure to heal, disease transmission from allograft, need for further surgery, dislocation, anesthetic risks, blood clots, risks of death,and others. The probabilities of surgical success and failure discussed with patient given their particular  co-morbidities.The time and nature of expected rehabilitation and recovery was discussed.The patient's questions were all answered preoperatively.  No barriers to understanding were noted. I explained the natural history of the disease process and Rx rationale.  I explained to the patient what I considered to be reasonable expectations given their personal situation.  The final treatment plan was arrived at through a shared patient decision making process model.      I personally saw and evaluated the patient, and participated in the management and treatment plan.  Huel Cote, MD Attending Physician, Orthopedic Surgery  This document was dictated using Dragon voice recognition software. A reasonable attempt at proof reading has been made to minimize errors.

## 2022-10-14 ENCOUNTER — Telehealth: Payer: Self-pay

## 2022-10-14 ENCOUNTER — Encounter (HOSPITAL_BASED_OUTPATIENT_CLINIC_OR_DEPARTMENT_OTHER): Payer: Self-pay | Admitting: Orthopaedic Surgery

## 2022-10-14 NOTE — Telephone Encounter (Signed)
  Patient Consent for Virtual Visit        [name] has provided verbal consent on 10/14/2022 for a virtual visit (video or telephone).   CONSENT FOR VIRTUAL VISIT FOR:  Douglas Carney  By participating in this virtual visit I agree to the following:  I hereby voluntarily request, consent and authorize [facility] and its employed or contracted physicians, physician assistants, nurse practitioners or other licensed health care professionals (the Practitioner), to provide me with telemedicine health care services (the "Services") as deemed necessary by the treating Practitioner. I acknowledge and consent to receive the Services by the Practitioner via telemedicine. I understand that the telemedicine visit will involve communicating with the Practitioner through live audiovisual communication technology and the disclosure of certain medical information by electronic transmission. I acknowledge that I have been given the opportunity to request an in-person assessment or other available alternative prior to the telemedicine visit and am voluntarily participating in the telemedicine visit.  I understand that I have the right to withhold or withdraw my consent to the use of telemedicine in the course of my care at any time, without affecting my right to future care or treatment, and that the Practitioner or I may terminate the telemedicine visit at any time. I understand that I have the right to inspect all information obtained and/or recorded in the course of the telemedicine visit and may receive copies of available information for a reasonable fee.  I understand that some of the potential risks of receiving the Services via telemedicine include:  Delay or interruption in medical evaluation due to technological equipment failure or disruption; Information transmitted may not be sufficient (e.g. poor resolution of images) to allow for appropriate medical decision making by the Practitioner;  and/or  In rare instances, security protocols could fail, causing a breach of personal health information.  Furthermore, I acknowledge that it is my responsibility to provide information about my medical history, conditions and care that is complete and accurate to the best of my ability. I acknowledge that Practitioner's advice, recommendations, and/or decision may be based on factors not within their control, such as incomplete or inaccurate data provided by me or distortions of diagnostic images or specimens that may result from electronic transmissions. I understand that the practice of medicine is not an exact science and that Practitioner makes no warranties or guarantees regarding treatment outcomes. I acknowledge that a copy of this consent can be made available to me via my patient portal [facility] MyChart), or I can request a printed copy by calling the office of [facility].    I understand that my insurance will be billed for this visit.   I have read or had this consent read to me. I understand the contents of this consent, which adequately explains the benefits and risks of the Services being provided via telemedicine.  I have been provided ample opportunity to ask questions regarding this consent and the Services and have had my questions answered to my satisfaction. I give my informed consent for the services to be provided through the use of telemedicine in my medical care

## 2022-10-14 NOTE — Telephone Encounter (Signed)
Televisit has now been scheduled, patient agrees to appt date and time.

## 2022-10-14 NOTE — Telephone Encounter (Signed)
   Name: Tyrell Starck  DOB: 02-Sep-1950  MRN: [id number]  Primary Cardiologist: Verne Carrow, MD  Chart reviewed as part of pre-operative protocol coverage. Because of Yochanon Helser's past medical history and time since last visit, he will require a follow-up telephone visit in order to better assess preoperative cardiovascular risk.  Pre-op covering staff: - Please schedule appointment and call patient to inform them. If patient already had an upcoming appointment within acceptable timeframe, please add "pre-op clearance" to the appointment notes so provider is aware. - Please contact requesting surgeon's office via preferred method (i.e, phone, fax) to inform them of need for appointment prior to surgery.  No medications indicated as needing held.   Sharlene Dory, PA-C  10/14/2022, 1:24 PM

## 2022-10-14 NOTE — Telephone Encounter (Signed)
   Pre-operative Risk Assessment    Patient Name: Douglas Carney  DOB: [birth year] MRN: [id number]      Request for Surgical Clearance    Procedure:   Left Reverse Shoulder Arthroplasty  Date of Surgery:  Clearance TBD                                 Surgeon:  Not Indicated Surgeon's Group or Practice Name:  [facility] Phone number:  [phone] Fax number:  [phone]   Type of Clearance Requested:   - Medical    Type of Anesthesia:   Regional Block   Additional requests/questions:    [name]   10/14/2022, 12:49 PM

## 2022-10-15 ENCOUNTER — Ambulatory Visit: Payer: Medicare HMO | Admitting: Cardiovascular Disease

## 2022-10-20 DIAGNOSIS — I10 Essential (primary) hypertension: Secondary | ICD-10-CM | POA: Diagnosis not present

## 2022-10-22 ENCOUNTER — Ambulatory Visit: Payer: Medicare HMO | Attending: Cardiovascular Disease

## 2022-10-22 DIAGNOSIS — Z01810 Encounter for preprocedural cardiovascular examination: Secondary | ICD-10-CM | POA: Diagnosis not present

## 2022-10-22 NOTE — Progress Notes (Signed)
Virtual Visit via Telephone Note   Because of Douglas Carney's co-morbid illnesses, he is at least at moderate risk for complications without adequate follow up.  This format is felt to be most appropriate for this patient at this time.  The patient did not have access to video technology/had technical difficulties with video requiring transitioning to audio format only (telephone).  All issues noted in this document were discussed and addressed.  No physical exam could be performed with this format.  Please refer to the patient's chart for his consent to telehealth for Douglas Carney.  Evaluation Performed:  Preoperative cardiovascular risk assessment _____________   Date:  10/22/2022   Patient ID:  Douglas Carney, DOB [birth year], MRN [id number] Patient Location:  Home Provider location:   Office  Primary Care Provider:  Merri Brunette, MD Primary Cardiologist:  Verne Carrow, MD  Chief Complaint / Patient Profile   72 y.o. y/o male with a h/o BPH, HTN, thoracic aortic aneurym and SVT  who is pending left reverse shoulder arthroplasty and presents today for telephonic preoperative cardiovascular risk assessment.  History of Present Illness    Douglas Carney is a 72 y.o. male who presents via Web designer for a telehealth visit today.  Pt was last seen in cardiology clinic on 04/10/2022 by Dr.Mcalhany. At that time Douglas Carney was doing well cardiac complaints.  He recently completed hip surgery and blood pressure was controlled. Most recent CT for aneurysm surveillance completed 08/2021 showing stable aneurysm at 4 cm. The patient is now pending procedure as outlined above. Since his last visit, he has been doing well with no new cardiac complaints.  He is monitoring his blood pressures at home and they are staying stable with no spikes or abnormal readings.  He reports 1 episode of SVT that occurred while doing a talk and standing that did not result in a blackout and  resolved spontaneously.  He denies chest pain, shortness of breath, lower extremity edema, fatigue, palpitations, melena, hematuria, hemoptysis, diaphoresis, weakness, presyncope, syncope, orthopnea, and PND.     Past Medical History    Past Medical History:  Diagnosis Date   Arthritis    right hip   BPH (benign prostatic hyperplasia)    Cancer (HCC) 2005   melanoma removed right shoulder   COVID 1-18-2-22   fever chills body aches and cough x 5 days all symptoms resolved   Family history of adverse reaction to anesthesia    mother had brain fog after anesthesia lasted 4 to 5 dsys   Hypertension    Syncope    Wears glasses    Past Surgical History:  Procedure Laterality Date   APPENDECTOMY     right shoulder fracture surgery  07/14/2003   right shoulder pins removed  2027   wires and screws also removed   TONSILLECTOMY  age 32   and adenoids removed   TRANSURETHRAL RESECTION OF PROSTATE N/A 04/12/2020   Procedure: TRANSURETHRAL RESECTION OF THE PROSTATE (TURP);  Surgeon: Bjorn Pippin, MD;  Location: [facility];  Service: Urology;  Laterality: N/A;    Allergies  Allergies  Allergen Reactions   Contrast Media [Iodinated Contrast Media] Anaphylaxis   Meloxicam Diarrhea    Other Reaction(s): Other (See Comments)  GI upset    Home Medications    Prior to Admission medications   Medication Sig Start Date End Date Taking? Authorizing Provider  amLODipine (NORVASC) 5 MG tablet Take 5 mg by mouth daily. 07/21/22   [provider]  fluorouracil (EFUDEX) 5 % cream Apply topically 2 (two) times daily. For sun spot areas to both ears and right temple Patient not taking: Reported on 10/14/2022    [provider]  losartan-hydrochlorothiazide (HYZAAR) 100-25 MG tablet Take 1 tablet by mouth daily. 09/21/22   [provider]  olmesartan (BENICAR) 40 MG tablet Take 40 mg by mouth daily. Patient not taking: Reported on 10/14/2022 08/10/22    [provider]    Physical Exam    Vital Signs:  Douglas Carney does not have vital signs available for review today.  Given telephonic nature of communication, physical exam is limited. AAOx3. NAD. Normal affect.  Speech and respirations are unlabored.  Accessory Clinical Findings    None  Assessment & Plan    1.  Preoperative Cardiovascular Risk Assessment: -Patient's RCRI score is 0.9%  The patient affirms he has been doing well without any new cardiac symptoms. They are able to achieve 7 METS without cardiac limitations. Therefore, based on ACC/AHA guidelines, the patient would be at acceptable risk for the planned procedure without further cardiovascular testing. The patient was advised that if he develops new symptoms prior to surgery to contact our office to arrange for a follow-up visit, and he verbalized understanding.   The patient was advised that if he develops new symptoms prior to surgery to contact our office to arrange for a follow-up visit, and he verbalized understanding.  A copy of this note will be routed to requesting surgeon.  Time:   Today, I have spent 8 minutes with the patient with telehealth technology discussing medical history, symptoms, and management plan.     Napoleon Form, Leodis Rains, NP  10/22/2022, 7:18 AM

## 2022-10-31 ENCOUNTER — Encounter (HOSPITAL_BASED_OUTPATIENT_CLINIC_OR_DEPARTMENT_OTHER): Payer: Self-pay | Admitting: Emergency Medicine

## 2022-10-31 ENCOUNTER — Emergency Department (HOSPITAL_BASED_OUTPATIENT_CLINIC_OR_DEPARTMENT_OTHER): Payer: Medicare HMO | Admitting: Radiology

## 2022-10-31 ENCOUNTER — Emergency Department (HOSPITAL_BASED_OUTPATIENT_CLINIC_OR_DEPARTMENT_OTHER): Payer: Medicare HMO

## 2022-10-31 ENCOUNTER — Other Ambulatory Visit: Payer: Self-pay

## 2022-10-31 ENCOUNTER — Inpatient Hospital Stay (HOSPITAL_BASED_OUTPATIENT_CLINIC_OR_DEPARTMENT_OTHER)
Admission: EM | Admit: 2022-10-31 | Discharge: 2022-11-02 | DRG: 178 | Disposition: A | Discharge status: Home / Self Care | Payer: Medicare HMO | Attending: Internal Medicine | Admitting: Internal Medicine

## 2022-10-31 DIAGNOSIS — I444 Left anterior fascicular block: Secondary | ICD-10-CM | POA: Diagnosis present

## 2022-10-31 DIAGNOSIS — J90 Pleural effusion, not elsewhere classified: Secondary | ICD-10-CM | POA: Diagnosis present

## 2022-10-31 DIAGNOSIS — J690 Pneumonitis due to inhalation of food and vomit: Principal | ICD-10-CM

## 2022-10-31 DIAGNOSIS — R918 Other nonspecific abnormal finding of lung field: Secondary | ICD-10-CM | POA: Diagnosis not present

## 2022-10-31 DIAGNOSIS — N400 Enlarged prostate without lower urinary tract symptoms: Secondary | ICD-10-CM | POA: Diagnosis present

## 2022-10-31 DIAGNOSIS — Z807 Family history of other malignant neoplasms of lymphoid, hematopoietic and related tissues: Secondary | ICD-10-CM

## 2022-10-31 DIAGNOSIS — I700 Atherosclerosis of aorta: Secondary | ICD-10-CM

## 2022-10-31 DIAGNOSIS — M1990 Unspecified osteoarthritis, unspecified site: Secondary | ICD-10-CM | POA: Insufficient documentation

## 2022-10-31 DIAGNOSIS — Z808 Family history of malignant neoplasm of other organs or systems: Secondary | ICD-10-CM

## 2022-10-31 DIAGNOSIS — Z91041 Radiographic dye allergy status: Secondary | ICD-10-CM

## 2022-10-31 DIAGNOSIS — I10 Essential (primary) hypertension: Secondary | ICD-10-CM | POA: Diagnosis not present

## 2022-10-31 DIAGNOSIS — G4733 Obstructive sleep apnea (adult) (pediatric): Secondary | ICD-10-CM | POA: Diagnosis present

## 2022-10-31 DIAGNOSIS — E669 Obesity, unspecified: Secondary | ICD-10-CM | POA: Diagnosis not present

## 2022-10-31 DIAGNOSIS — I712 Thoracic aortic aneurysm, without rupture, unspecified: Secondary | ICD-10-CM

## 2022-10-31 DIAGNOSIS — Z1152 Encounter for screening for COVID-19: Secondary | ICD-10-CM | POA: Diagnosis not present

## 2022-10-31 DIAGNOSIS — R0789 Other chest pain: Secondary | ICD-10-CM | POA: Diagnosis not present

## 2022-10-31 DIAGNOSIS — Z9079 Acquired absence of other genital organ(s): Secondary | ICD-10-CM | POA: Diagnosis not present

## 2022-10-31 DIAGNOSIS — J168 Pneumonia due to other specified infectious organisms: Secondary | ICD-10-CM | POA: Diagnosis not present

## 2022-10-31 DIAGNOSIS — Z886 Allergy status to analgesic agent status: Secondary | ICD-10-CM

## 2022-10-31 DIAGNOSIS — E785 Hyperlipidemia, unspecified: Secondary | ICD-10-CM | POA: Diagnosis present

## 2022-10-31 DIAGNOSIS — K760 Fatty (change of) liver, not elsewhere classified: Secondary | ICD-10-CM

## 2022-10-31 DIAGNOSIS — R9389 Abnormal findings on diagnostic imaging of other specified body structures: Secondary | ICD-10-CM | POA: Diagnosis not present

## 2022-10-31 DIAGNOSIS — J189 Pneumonia, unspecified organism: Secondary | ICD-10-CM

## 2022-10-31 DIAGNOSIS — D72829 Elevated white blood cell count, unspecified: Secondary | ICD-10-CM

## 2022-10-31 DIAGNOSIS — J984 Other disorders of lung: Secondary | ICD-10-CM | POA: Diagnosis not present

## 2022-10-31 DIAGNOSIS — Z6837 Body mass index (BMI) 37.0-37.9, adult: Secondary | ICD-10-CM | POA: Diagnosis not present

## 2022-10-31 DIAGNOSIS — E876 Hypokalemia: Secondary | ICD-10-CM

## 2022-10-31 DIAGNOSIS — Z79899 Other long term (current) drug therapy: Secondary | ICD-10-CM | POA: Diagnosis not present

## 2022-10-31 DIAGNOSIS — R079 Chest pain, unspecified: Secondary | ICD-10-CM | POA: Diagnosis not present

## 2022-10-31 DIAGNOSIS — R071 Chest pain on breathing: Secondary | ICD-10-CM | POA: Diagnosis not present

## 2022-10-31 DIAGNOSIS — Z85820 Personal history of malignant melanoma of skin: Secondary | ICD-10-CM

## 2022-10-31 LAB — HEPATIC FUNCTION PANEL
ALT: 20 U/L (ref 0–44)
AST: 10 U/L — ABNORMAL LOW (ref 15–41)
Albumin: 4.3 g/dL (ref 3.5–5.0)
Alkaline Phosphatase: 62 U/L (ref 38–126)
Bilirubin, Direct: 0.2 mg/dL (ref 0.0–0.2)
Indirect Bilirubin: 1 mg/dL — ABNORMAL HIGH (ref 0.3–0.9)
Total Bilirubin: 1.2 mg/dL (ref 0.3–1.2)
Total Protein: 6.6 g/dL (ref 6.5–8.1)

## 2022-10-31 LAB — BASIC METABOLIC PANEL
Anion gap: 11 (ref 5–15)
BUN: 11 mg/dL (ref 8–23)
CO2: 24 mmol/L (ref 22–32)
Calcium: 8.9 mg/dL (ref 8.9–10.3)
Chloride: 100 mmol/L (ref 98–111)
Creatinine, Ser: 0.87 mg/dL (ref 0.61–1.24)
GFR, Estimated: >60 mL/min (ref >60)
Glucose, Bld: 116 mg/dL — ABNORMAL HIGH (ref 70–99)
Potassium: 3.4 mmol/L — ABNORMAL LOW (ref 3.5–5.1)
Sodium: 135 mmol/L (ref 135–145)

## 2022-10-31 LAB — D-DIMER, QUANTITATIVE: D-Dimer, Quant: 0.5 ug{FEU}/mL (ref 0.00–0.50)

## 2022-10-31 LAB — CBC
HCT: 43.4 % (ref 39.0–52.0)
Hemoglobin: 15.4 g/dL (ref 13.0–17.0)
MCH: 32.8 pg (ref 26.0–34.0)
MCHC: 35.5 g/dL (ref 30.0–36.0)
MCV: 92.5 fL (ref 80.0–100.0)
Platelets: 174 10*3/uL (ref 150–400)
RBC: 4.69 MIL/uL (ref 4.22–5.81)
RDW: 12.2 % (ref 11.5–15.5)
WBC: 14.1 10*3/uL — ABNORMAL HIGH (ref 4.0–10.5)
nRBC: 0 % (ref 0.0–0.2)

## 2022-10-31 LAB — SARS CORONAVIRUS 2 BY RT PCR: SARS Coronavirus 2 by RT PCR: NEGATIVE

## 2022-10-31 LAB — CBG MONITORING, ED: Glucose-Capillary: 117 mg/dL — ABNORMAL HIGH (ref 70–99)

## 2022-10-31 LAB — TROPONIN I (HIGH SENSITIVITY)
Troponin I (High Sensitivity): 3 ng/L (ref <18)
Troponin I (High Sensitivity): 3 ng/L (ref <18)

## 2022-10-31 LAB — LACTIC ACID, PLASMA: Lactic Acid, Venous: 1.3 mmol/L (ref 0.5–1.9)

## 2022-10-31 MED ORDER — KETOROLAC TROMETHAMINE 30 MG/ML IJ SOLN
30.0000 mg | Freq: Once | INTRAMUSCULAR | Status: DC
  Filled 2022-10-31: qty 1

## 2022-10-31 MED ORDER — SODIUM CHLORIDE 0.9 % IV SOLN: 250.0000 mL | INTRAVENOUS | Status: DC | PRN

## 2022-10-31 MED ORDER — ENOXAPARIN SODIUM 40 MG/0.4ML IJ SOSY
40.0000 mg | PREFILLED_SYRINGE | INTRAMUSCULAR | Status: DC
  Administered 2022-10-31 – 2022-11-01 (×2): 40 mg via SUBCUTANEOUS
  Filled 2022-10-31 (×2): qty 0.4

## 2022-10-31 MED ORDER — METRONIDAZOLE 500 MG/100ML IV SOLN
500.0000 mg | Freq: Once | INTRAVENOUS | Status: AC
  Administered 2022-10-31: 500 mg via INTRAVENOUS
  Filled 2022-10-31: qty 100

## 2022-10-31 MED ORDER — KETOROLAC TROMETHAMINE 15 MG/ML IJ SOLN: 15.0000 mg | Freq: Three times a day (TID) | INTRAMUSCULAR | Status: DC

## 2022-10-31 MED ORDER — SODIUM CHLORIDE 0.9 % IV SOLN
1.0000 g | Freq: Once | INTRAVENOUS | Status: AC
  Administered 2022-10-31: 1 g via INTRAVENOUS
  Filled 2022-10-31: qty 10

## 2022-10-31 MED ORDER — SODIUM CHLORIDE 0.9 % IV SOLN
100.0000 mg | Freq: Two times a day (BID) | INTRAVENOUS | Status: DC
  Administered 2022-10-31: 100 mg via INTRAVENOUS
  Filled 2022-10-31 (×2): qty 100

## 2022-10-31 MED ORDER — GUAIFENESIN ER 600 MG PO TB12: 600.0000 mg | ORAL_TABLET | Freq: Two times a day (BID) | ORAL | Status: DC | PRN

## 2022-10-31 MED ORDER — AMLODIPINE BESYLATE 5 MG PO TABS
5.0000 mg | ORAL_TABLET | Freq: Every day | ORAL | Status: DC
  Administered 2022-10-31 – 2022-11-02 (×3): 5 mg via ORAL
  Filled 2022-10-31 (×3): qty 1

## 2022-10-31 MED ORDER — LOSARTAN POTASSIUM-HCTZ 100-25 MG PO TABS: 1.0000 | ORAL_TABLET | Freq: Every day | ORAL | Status: DC

## 2022-10-31 MED ORDER — ENOXAPARIN SODIUM 60 MG/0.6ML IJ SOSY: 60.0000 mg | PREFILLED_SYRINGE | INTRAMUSCULAR | Status: DC

## 2022-10-31 MED ORDER — SODIUM CHLORIDE 0.9% FLUSH
3.0000 mL | Freq: Two times a day (BID) | INTRAVENOUS | Status: DC
  Administered 2022-10-31 – 2022-11-01 (×3): 3 mL via INTRAVENOUS

## 2022-10-31 MED ORDER — ONDANSETRON HCL 4 MG PO TABS: 4.0000 mg | ORAL_TABLET | Freq: Four times a day (QID) | ORAL | Status: DC | PRN

## 2022-10-31 MED ORDER — SENNOSIDES-DOCUSATE SODIUM 8.6-50 MG PO TABS: 1.0000 | ORAL_TABLET | Freq: Every evening | ORAL | Status: DC | PRN

## 2022-10-31 MED ORDER — KETOROLAC TROMETHAMINE 15 MG/ML IJ SOLN
15.0000 mg | Freq: Three times a day (TID) | INTRAMUSCULAR | Status: DC
  Administered 2022-10-31 – 2022-11-02 (×5): 15 mg via INTRAVENOUS
  Filled 2022-10-31 (×5): qty 1

## 2022-10-31 MED ORDER — HYDRALAZINE HCL 20 MG/ML IJ SOLN: 10.0000 mg | Freq: Three times a day (TID) | INTRAMUSCULAR | Status: DC | PRN

## 2022-10-31 MED ORDER — POTASSIUM CHLORIDE 20 MEQ PO PACK
40.0000 meq | PACK | Freq: Once | ORAL | Status: AC
  Administered 2022-10-31: 40 meq via ORAL
  Filled 2022-10-31: qty 2

## 2022-10-31 MED ORDER — LOSARTAN POTASSIUM 50 MG PO TABS: 100.0000 mg | ORAL_TABLET | Freq: Every day | ORAL | Status: DC

## 2022-10-31 MED ORDER — ACETAMINOPHEN 650 MG RE SUPP: 650.0000 mg | Freq: Four times a day (QID) | RECTAL | Status: DC | PRN

## 2022-10-31 MED ORDER — SODIUM CHLORIDE 0.9% FLUSH: 3.0000 mL | INTRAVENOUS | Status: DC | PRN

## 2022-10-31 MED ORDER — BENZONATATE 100 MG PO CAPS: 100.0000 mg | ORAL_CAPSULE | Freq: Three times a day (TID) | ORAL | Status: DC | PRN

## 2022-10-31 MED ORDER — PIPERACILLIN-TAZOBACTAM 3.375 G IVPB
3.3750 g | Freq: Three times a day (TID) | INTRAVENOUS | Status: DC
  Administered 2022-11-01 (×2): 3.375 g via INTRAVENOUS
  Filled 2022-10-31 (×2): qty 50

## 2022-10-31 MED ORDER — ACETAMINOPHEN 325 MG PO TABS: 650.0000 mg | ORAL_TABLET | Freq: Four times a day (QID) | ORAL | Status: DC | PRN

## 2022-10-31 MED ORDER — HYDROCHLOROTHIAZIDE 25 MG PO TABS: 25.0000 mg | ORAL_TABLET | Freq: Every day | ORAL | Status: DC

## 2022-10-31 MED ORDER — ONDANSETRON HCL 4 MG/2ML IJ SOLN: 4.0000 mg | Freq: Four times a day (QID) | INTRAMUSCULAR | Status: DC | PRN

## 2022-10-31 MED ORDER — SODIUM CHLORIDE 0.9 % IV SOLN
INTRAVENOUS | Status: DC | PRN
  Administered 2022-10-31: 10 mL/h via INTRAVENOUS

## 2022-10-31 MED ORDER — SODIUM CHLORIDE 0.9 % IV SOLN
500.0000 mg | Freq: Once | INTRAVENOUS | Status: AC
  Administered 2022-10-31: 500 mg via INTRAVENOUS
  Filled 2022-10-31: qty 5

## 2022-10-31 MED ORDER — LEVALBUTEROL HCL 0.63 MG/3ML IN NEBU
0.6300 mg | INHALATION_SOLUTION | Freq: Four times a day (QID) | RESPIRATORY_TRACT | Status: DC | PRN
  Administered 2022-10-31: 0.63 mg via RESPIRATORY_TRACT
  Filled 2022-10-31: qty 3

## 2022-10-31 NOTE — ED Provider Notes (Signed)
[facility] AT [facility] Provider Note   CSN: [id number] Arrival date & time: 10/31/22  1126     History  Chief Complaint  Patient presents with   Chest Pain    Douglas Carney is a 72 y.o. male.  HPI     71 year old male with a history of hypertension, thoracic aortic aneurysm, SVT, BPH, who presents with concern for right sided chest pain, shortness of breath, pleuritic pain.   Had recent preoperative cardiology telehealth visit 8/22   Last night began to have right sided chest pain, severe, worse with deep breaths and coughing, also worse with swallowing, feel short of breath because can't breathe normally No abdominal pain. No cough.  No known fever  NO nausea, vomiting, lightheadedness. No leg pain or swelling No hx of pain like this before except gallbladder attack but now gb removed.   No hx of blood clots, long trips. No fam hx of blood clots.   Home Medications Prior to Admission medications   Medication Sig Start Date End Date Taking? Authorizing Provider  amLODipine (NORVASC) 5 MG tablet Take 5 mg by mouth daily. 07/21/22   [provider]  fluorouracil (EFUDEX) 5 % cream Apply topically 2 (two) times daily. For sun spot areas to both ears and right temple Patient not taking: Reported on 10/14/2022    [provider]  losartan-hydrochlorothiazide (HYZAAR) 100-25 MG tablet Take 1 tablet by mouth daily. 09/21/22   [provider]  olmesartan (BENICAR) 40 MG tablet Take 40 mg by mouth daily. Patient not taking: Reported on 10/14/2022 08/10/22   [provider]      Allergies    Contrast media [iodinated contrast media] and Meloxicam    Review of Systems   Review of Systems  Physical Exam Updated Vital Signs BP 125/84   Pulse (!) 103   Temp 99.5 F (37.5 C) (Oral)   Resp (!) 28   Ht 6\' 2"  (1.88 m)   Wt 131.5 kg   SpO2 94%   BMI 37.23 kg/m  Physical Exam Vitals and nursing note reviewed.   Constitutional:      General: He is not in acute distress.    Appearance: He is well-developed. He is ill-appearing. He is not diaphoretic.  HENT:     Head: Normocephalic and atraumatic.  Eyes:     Conjunctiva/sclera: Conjunctivae normal.  Cardiovascular:     Rate and Rhythm: Normal rate and regular rhythm.     Heart sounds: Normal heart sounds. No murmur heard.    No friction rub. No gallop.  Pulmonary:     Effort: Pulmonary effort is normal. Tachypnea present. No respiratory distress.     Breath sounds: Normal breath sounds. No wheezing or rales.  Abdominal:     General: There is no distension.     Palpations: Abdomen is soft.     Tenderness: There is no abdominal tenderness. There is no guarding.  Musculoskeletal:     Cervical back: Normal range of motion.  Skin:    General: Skin is warm and dry.  Neurological:     Mental Status: He is alert and oriented to person, place, and time.     ED Results / Procedures / Treatments   Labs (all labs ordered are listed, but only abnormal results are displayed) Labs Reviewed  BASIC METABOLIC PANEL - Abnormal; Notable for the following components:      Result Value   Potassium 3.4 (*)    Glucose, Bld 116 (*)  All other components within normal limits  CBC - Abnormal; Notable for the following components:   WBC 14.1 (*)    All other components within normal limits  HEPATIC FUNCTION PANEL - Abnormal; Notable for the following components:   AST 10 (*)    Indirect Bilirubin 1.0 (*)    All other components within normal limits  CBG MONITORING, ED - Abnormal; Notable for the following components:   Glucose-Capillary 117 (*)    All other components within normal limits  CULTURE, BLOOD (ROUTINE X 2)  CULTURE, BLOOD (ROUTINE X 2)  SARS CORONAVIRUS 2 BY RT PCR  D-DIMER, QUANTITATIVE  LACTIC ACID, PLASMA  LACTIC ACID, PLASMA  TROPONIN I (HIGH SENSITIVITY)  TROPONIN I (HIGH SENSITIVITY)    EKG EKG  Interpretation Date/Time:  Saturday October 31 2022 11:43:50 EDT Ventricular Rate:  113 PR Interval:  164 QRS Duration:  106 QT Interval:  328 QTC Calculation: 449 R Axis:   -59  Text Interpretation: Sinus tachycardia Left anterior fascicular block Possible Anterior infarct , age undetermined Abnormal ECG When compared with ECG of 21-Dec-2020 17:25, PREVIOUS ECG IS PRESENTRate has increased Confirmed by Alvira Monday ([id number]) on 10/31/2022 1:03:02 PM  Radiology CT Chest Wo Contrast  Result Date: 10/31/2022 CLINICAL DATA:  Chest wall pain, nontraumatic. Infection or inflammation suspected. Severe right chest pain, worse with inspiration. History of aneurysm and contrast allergy. EXAM: CT CHEST WITHOUT CONTRAST TECHNIQUE: Multidetector CT imaging of the chest was performed following the standard protocol without IV contrast. RADIATION DOSE REDUCTION: This exam was performed according to the departmental dose-optimization program which includes automated exposure control, adjustment of the mA and/or kV according to patient size and/or use of iterative reconstruction technique. COMPARISON:  Chest CT 09/08/2021. FINDINGS: Despite efforts by the technologist and patient, mild motion artifact is present on today's exam and could not be eliminated. This reduces exam sensitivity and specificity. Cardiovascular: No acute vascular findings on noncontrast imaging. There is mild aortic atherosclerosis. The heart size is normal. There is no pericardial effusion. Mediastinum/Nodes: There are no enlarged mediastinal, hilar or axillary lymph nodes.Hilar assessment is limited by the lack of intravenous contrast, although the hilar contours appear unchanged. The thyroid gland, trachea and esophagus demonstrate no significant findings. Lungs/Pleura: Trace right pleural effusion. No pneumothorax. New dependent airspace disease at both lung bases with associated air bronchograms. In addition, there are streaky opacities  within the right middle lobe and lingula. Scattered small calcified granulomas are again noted. No suspicious pulmonary nodules. Upper abdomen: Diffuse low density throughout the liver consistent with steatosis. Previous cholecystectomy. No acute findings. Musculoskeletal/Chest wall: There is no chest wall mass or suspicious osseous finding. No evidence of rib fracture. There are prominent partially ankylosing osteophytes throughout the thoracic spine favoring diffuse idiopathic skeletal hyperostosis. Glenohumeral degenerative changes noted on the right. Unless specific follow-up recommendations are mentioned in the findings or impression sections, no imaging follow-up of any mentioned incidental findings is recommended. IMPRESSION: 1. New dependent airspace disease at both lung bases with associated air bronchograms, suspicious for pneumonia. Consider aspiration. Recommend chest radiographic follow-up. 2. Trace right pleural effusion. 3. No evidence of rib fracture or chest wall mass. 4. Hepatic steatosis. 5.  Aortic Atherosclerosis (ICD10-I70.0). Electronically Signed   By: Carey Bullocks M.D.   On: 10/31/2022 14:46   DG Chest 2 View  Result Date: 10/31/2022 CLINICAL DATA:  Chest pain pain EXAM: CHEST - 2 VIEW COMPARISON:  12/21/2020 FINDINGS: Low volume film with asymmetric elevation right hemidiaphragm. Bibasilar atelectasis  or infiltrate noted with tiny bilateral effusions. No pulmonary edema. Cardiopericardial silhouette is at upper limits of normal for size. No acute bony abnormality. IMPRESSION: Low volume film with bibasilar atelectasis or infiltrate and tiny bilateral effusions. Electronically Signed   By: Kennith Center M.D.   On: 10/31/2022 12:51    Procedures Procedures    Medications Ordered in ED Medications  cefTRIAXone (ROCEPHIN) 1 g in sodium chloride 0.9 % 100 mL IVPB (1 g Intravenous New Bag/Given 10/31/22 1602)  azithromycin (ZITHROMAX) 500 mg in sodium chloride 0.9 % 250 mL IVPB  (500 mg Intravenous New Bag/Given 10/31/22 1556)  metroNIDAZOLE (FLAGYL) IVPB 500 mg (500 mg Intravenous New Bag/Given 10/31/22 1543)  ketorolac (TORADOL) 30 MG/ML injection 30 mg (30 mg Intravenous Not Given 10/31/22 1525)  0.9 %  sodium chloride infusion (10 mL/hr Intravenous New Bag/Given 10/31/22 1602)    ED Course/ Medical Decision Making/ A&P                                 72 year old male with a history of hypertension, thoracic aortic aneurysm, SVT, BPH, who presents with concern for right sided chest pain, shortness of breath, pleuritic pain.  Differential dateincludes PE, aortic dissection, pneumonia, pulmonary edema, pneumothorax, ACS, musculoskeletal pain.  Vital signs significant for temperature 99.5, tachycardia, tachypnea with patient splinting due to pain with deep breaths.  Discussed clinical concern for possible PE--however he has anaphylaxis to IV contrast.  Ordered CT WO contrast to evaluate for other etiologies of severe pain such as rib fracture, screen aortic aneurysm size.  Obtained labs which personally abided interpreted by me show no evidence of transaminitis, normal troponin x 2 and have low suspicion for ACS, mild hypokalemia, leukocytosis of 14,000.  He has normal bilateral upper and lower extremity pulses, is hemodynamically stable, D-dimer is negative, and have low clinical suspicion at this time for aortic dissection or PE.  CT does show new dependent airspace disease at both lungs suspicious for pneumonia, as well as a trace right pleural effusion, which I feel is likely the etiology of his pleuritic right chest pain.  Given his leukocytosis, tachycardia, significant pain with splinting and tachypnea, will admit for an IV antibiotics, continued monitoring and pain control for pneumonia/effusion.    Blood cx, lactate pending.  Given Rocephin and azithromycin as well as Flagyl for possible aspiration.         Final Clinical Impression(s) / ED  Diagnoses Final diagnoses:  Pneumonia of both lower lobes due to infectious organism  Pleural effusion on right  Right-sided chest pain  Leukocytosis, unspecified type    Rx / DC Orders ED Discharge Orders     None         Alvira Monday, MD 10/31/22 1607

## 2022-10-31 NOTE — Progress Notes (Addendum)
Plan of Care Note for accepted transfer   Patient: Douglas Carney MRN: [id number]   DOA: 10/31/2022  Facility requesting transfer: [facility]   Requesting Provider: Dr. Dalene Seltzer   Reason for transfer: Pneumonia   Facility course: 71 yr old man with HTN, BPH, SVT, and thoracic aortic aneurysm who presents with right-sided pleuritic pain.   He is afebrile and slightly tachycardic with WBC 14,100 and CT findings suspicious for bibasilar pneumonia.   He was treated with Rocephin, azithromycin, and Flagyl in the ED.   Plan of care: The patient is accepted for admission to Telemetry unit, at [facility] (pt asked for [facility] instead of [facility]).   Author: Briscoe Deutscher, MD 10/31/2022  Check [email or web address] for on-call coverage.  Nursing staff, Please call [facility] Admits & Consults System-Wide number on Amion as soon as patient's arrival, so appropriate admitting provider can evaluate the pt.

## 2022-10-31 NOTE — ED Triage Notes (Signed)
Pt arrives pov, bib wheelchair with c/o RT side CP, shob, pain with deep inspiration that started last night. PT tearful in triage. PT reports new CPAP x 5 weeks, unable to use lasat night

## 2022-10-31 NOTE — Progress Notes (Signed)
Pt BIB ambulance.  Pt ambulated from stretcher to chair.  States his breathing feels better sitting up.  Pt placed on tele, VS obtained, on 3L  (acute). Skin assessment completed with 2nd RN.  Will report off to oncoming RN

## 2022-10-31 NOTE — H&P (Addendum)
History and Physical    Douglas Carney ZOX:[id number] DOB: Oct 25, 1950 DOA: 10/31/2022  PCP: Merri Brunette, MD   Patient coming from: Home   Chief Complaint:  Chief Complaint  Patient presents with   Chest Pain    HPI:  Douglas Carney is a 72 y.o. male with medical history significant of OSA on CPAP, essential hypertension, BPH, hyperlipidemia, osteoarthritis and thoracic aortic aneurysm presented to emergency department with complaining of right-sided chest pain.  Reported that he accidentally vomited while using his CPAP machine at home and aspirated some of the vomiting few days ago.  Since then he has been developing some cough and low-grade fever at home.  Patient denies any chills.  He has only dry cough.  Denies any nausea, abdominal pain, vomiting, chest pain, palpitation and headache.  No other complaint at this time. Patient stated that he does not like to use the CPAP patient tonight rather than like to use the nasal cannula oxygen at sleep time.   ED Course:  At presentation to ED, patient is afebrile.  Found to be tachycardic 107, blood pressure 125/90, respiratory to 18 and O2 sat 94% room air. BMP showed sodium 135, slightly low potassium 3.4, chloride 100, bicarb 24, blood glucose 116, BUN 11, creatinine 0.87, calcium 8.9 GFR above 60 and anion gap 11. CBC showing leukocytosis 14.1 otherwise unremarkable. High sensitive troponin x 2 normal range.  D-dimer within normal range. Hepatic function panel-unremarkable. COVID PCR negative. Blood cultures are pending.  EKG showing sinus tachycardia heart rate 111, left anterior fascicular block.  There is no ST and T wave abnormality.  Chest x-ray showing low volume film with bilateral atelectasis or infiltrate and tiny bilateral effusion.  CT chest IMPRESSION: 1. New dependent airspace disease at both lung bases with associated air bronchograms, suspicious for pneumonia. Consider aspiration. Recommend chest radiographic  follow-up. 2. Trace right pleural effusion. 3. No evidence of rib fracture or chest wall mass. 4. Hepatic steatosis. 5.  Aortic Atherosclerosis   In the ED patient has been started treating with ceftriaxone, azithromycin and metronidazole. Patient has been transferred to [facility] for admission and further management of pneumonia.     Review of Systems:  Review of Systems  Constitutional:  Negative for chills, fever, malaise/fatigue and weight loss.  Respiratory:  Positive for cough. Negative for sputum production, shortness of breath and wheezing.        Right-sided chest pain with deep inspiration and with cough  Cardiovascular:  Negative for chest pain and palpitations.  Gastrointestinal:  Negative for heartburn, nausea and vomiting.  Musculoskeletal:  Negative for myalgias and neck pain.  Neurological:  Negative for dizziness and headaches.  Psychiatric/Behavioral:  The patient is not nervous/anxious.     Past Medical History:  Diagnosis Date   Arthritis    right hip   BPH (benign prostatic hyperplasia)    Cancer (HCC) 2005   melanoma removed right shoulder   COVID 1-18-2-22   fever chills body aches and cough x 5 days all symptoms resolved   Family history of adverse reaction to anesthesia    mother had brain fog after anesthesia lasted 4 to 5 dsys   Hypertension    Syncope    Wears glasses     Past Surgical History:  Procedure Laterality Date   APPENDECTOMY     CHOLECYSTECTOMY     right shoulder fracture surgery  07/14/2003   right shoulder pins removed  2027   wires and screws also removed  TONSILLECTOMY  age 40   and adenoids removed   TRANSURETHRAL RESECTION OF PROSTATE N/A 04/12/2020   Procedure: TRANSURETHRAL RESECTION OF THE PROSTATE (TURP);  Surgeon: Bjorn Pippin, MD;  Location: [facility];  Service: Urology;  Laterality: N/A;     reports that he has never smoked. He has never used smokeless tobacco. He reports current alcohol use. He  reports that he does not currently use drugs.  Allergies  Allergen Reactions   Contrast Media [Iodinated Contrast Media] Anaphylaxis   Meloxicam Diarrhea    Other Reaction(s): Other (See Comments)  GI upset    Family History  Problem Relation Age of Onset   Lymphoma Mother    Cancer Father 49       Melanoma    Prior to Admission medications   Medication Sig Start Date End Date Taking? Authorizing Provider  amLODipine (NORVASC) 5 MG tablet Take 5 mg by mouth daily. 07/21/22   [provider]  fluorouracil (EFUDEX) 5 % cream Apply topically 2 (two) times daily. For sun spot areas to both ears and right temple Patient not taking: Reported on 10/14/2022    [provider]  losartan-hydrochlorothiazide (HYZAAR) 100-25 MG tablet Take 1 tablet by mouth daily. 09/21/22   [provider]  olmesartan (BENICAR) 40 MG tablet Take 40 mg by mouth daily. Patient not taking: Reported on 10/14/2022 08/10/22   [provider]     Physical Exam: Vitals:   10/31/22 1448 10/31/22 1500 10/31/22 1630 10/31/22 1700  BP: 120/75 125/84 119/80 115/75  Pulse: (!) 101 (!) 103 (!) 101 (!) 107  Resp: (!) 23 (!) 28 (!) 32 (!) 29  Temp: 99.5 F (37.5 C)  99.1 F (37.3 C)   TempSrc: Oral  Oral   SpO2: 96% 94% 94% 96%  Weight:      Height:        Physical Exam Constitutional:      General: He is not in acute distress.    Appearance: He is not ill-appearing.  Cardiovascular:     Rate and Rhythm: Regular rhythm. Tachycardia present.     Heart sounds: Normal heart sounds.  Pulmonary:     Effort: Pulmonary effort is normal.     Breath sounds: No decreased breath sounds, wheezing, rhonchi or rales.  Musculoskeletal:     Right lower leg: No edema.     Left lower leg: No edema.  Skin:    General: Skin is dry.  Neurological:     General: No focal deficit present.     Mental Status: He is alert and oriented to person, place, and time.  Psychiatric:        Mood and  Affect: Mood normal. Mood is not anxious.      Labs on Admission: I have personally reviewed following labs and imaging studies  CBC: Recent Labs  Lab 10/31/22 1147  WBC 14.1*  HGB 15.4  HCT 43.4  MCV 92.5  PLT 174   Basic Metabolic Panel: Recent Labs  Lab 10/31/22 1147  NA 135  K 3.4*  CL 100  CO2 24  GLUCOSE 116*  BUN 11  CREATININE 0.87  CALCIUM 8.9   GFR: Estimated Creatinine Clearance: 113.9 mL/min (by C-G formula based on SCr of 0.87 mg/dL). Liver Function Tests: Recent Labs  Lab 10/31/22 1338  AST 10*  ALT 20  ALKPHOS 62  BILITOT 1.2  PROT 6.6  ALBUMIN 4.3   No results for input(s): "LIPASE", "AMYLASE" in the last 168  hours. No results for input(s): "AMMONIA" in the last 168 hours. Coagulation Profile: No results for input(s): "INR", "PROTIME" in the last 168 hours. Cardiac Enzymes: Recent Labs  Lab 10/31/22 1147 10/31/22 1338  TROPONINIHS 3 3   BNP (last 3 results) No results for input(s): "BNP" in the last 8760 hours. HbA1C: No results for input(s): "HGBA1C" in the last 72 hours. CBG: Recent Labs  Lab 10/31/22 1548  GLUCAP 117*   Lipid Profile: No results for input(s): "CHOL", "HDL", "LDLCALC", "TRIG", "CHOLHDL", "LDLDIRECT" in the last 72 hours. Thyroid Function Tests: No results for input(s): "TSH", "T4TOTAL", "FREET4", "T3FREE", "THYROIDAB" in the last 72 hours. Anemia Panel: No results for input(s): "VITAMINB12", "FOLATE", "FERRITIN", "TIBC", "IRON", "RETICCTPCT" in the last 72 hours. Urine analysis: No results found for: "COLORURINE", "APPEARANCEUR", "LABSPEC", "PHURINE", "GLUCOSEU", "HGBUR", "BILIRUBINUR", "KETONESUR", "PROTEINUR", "UROBILINOGEN", "NITRITE", "LEUKOCYTESUR"  Radiological Exams on Admission: I have personally reviewed images CT Chest Wo Contrast  Result Date: 10/31/2022 CLINICAL DATA:  Chest wall pain, nontraumatic. Infection or inflammation suspected. Severe right chest pain, worse with inspiration. History of  aneurysm and contrast allergy. EXAM: CT CHEST WITHOUT CONTRAST TECHNIQUE: Multidetector CT imaging of the chest was performed following the standard protocol without IV contrast. RADIATION DOSE REDUCTION: This exam was performed according to the departmental dose-optimization program which includes automated exposure control, adjustment of the mA and/or kV according to patient size and/or use of iterative reconstruction technique. COMPARISON:  Chest CT 09/08/2021. FINDINGS: Despite efforts by the technologist and patient, mild motion artifact is present on today's exam and could not be eliminated. This reduces exam sensitivity and specificity. Cardiovascular: No acute vascular findings on noncontrast imaging. There is mild aortic atherosclerosis. The heart size is normal. There is no pericardial effusion. Mediastinum/Nodes: There are no enlarged mediastinal, hilar or axillary lymph nodes.Hilar assessment is limited by the lack of intravenous contrast, although the hilar contours appear unchanged. The thyroid gland, trachea and esophagus demonstrate no significant findings. Lungs/Pleura: Trace right pleural effusion. No pneumothorax. New dependent airspace disease at both lung bases with associated air bronchograms. In addition, there are streaky opacities within the right middle lobe and lingula. Scattered small calcified granulomas are again noted. No suspicious pulmonary nodules. Upper abdomen: Diffuse low density throughout the liver consistent with steatosis. Previous cholecystectomy. No acute findings. Musculoskeletal/Chest wall: There is no chest wall mass or suspicious osseous finding. No evidence of rib fracture. There are prominent partially ankylosing osteophytes throughout the thoracic spine favoring diffuse idiopathic skeletal hyperostosis. Glenohumeral degenerative changes noted on the right. Unless specific follow-up recommendations are mentioned in the findings or impression sections, no imaging  follow-up of any mentioned incidental findings is recommended. IMPRESSION: 1. New dependent airspace disease at both lung bases with associated air bronchograms, suspicious for pneumonia. Consider aspiration. Recommend chest radiographic follow-up. 2. Trace right pleural effusion. 3. No evidence of rib fracture or chest wall mass. 4. Hepatic steatosis. 5.  Aortic Atherosclerosis (ICD10-I70.0). Electronically Signed   By: Carey Bullocks M.D.   On: 10/31/2022 14:46   DG Chest 2 View  Result Date: 10/31/2022 CLINICAL DATA:  Chest pain pain EXAM: CHEST - 2 VIEW COMPARISON:  12/21/2020 FINDINGS: Low volume film with asymmetric elevation right hemidiaphragm. Bibasilar atelectasis or infiltrate noted with tiny bilateral effusions. No pulmonary edema. Cardiopericardial silhouette is at upper limits of normal for size. No acute bony abnormality. IMPRESSION: Low volume film with bibasilar atelectasis or infiltrate and tiny bilateral effusions. Electronically Signed   By: Kennith Center M.D.   On:  10/31/2022 12:51    EKG: My personal interpretation of EKG shows:  EKG showing sinus tachycardia heart rate 111, left anterior fascicular block.  There is no ST and T wave abnormality.   Assessment/Plan: Principal Problem:   Aspiration pneumonia (HCC) Active Problems:   Thoracic aortic aneurysm [facility])   Essential hypertension   Osteoarthritis   Hypokalemia   Hepatic steatosis   Aortic atherosclerosis (HCC)    Assessment and Plan: Bilateral lower lobe aspiration pneumonia Right-sided pleuritic chest pain due to pneumonia Trace right pleural effusion -Patient coming with right-sided chest pain that gets worse with cough and deep breathing.  He reported aspirating of vomiting while using his CPAP machine at home. - Patient is afebrile.  Hemodynamically stable.  O2 sat 96% room air. - Chest x-ray showing Low volume film with bibasilar atelectasis or infiltrate and tiny bilateral effusions. -CTA chest showed  new dependent airspace disease of the both lung bases associated with ER bronchogram, suspicion for pneumonia likely aspiration.  Tiny right-sided pleural effusion. -Patient has some leukocytosis 14. -Normal troponin and D-dimer. -In the ED patient has been treated with ceftriaxone, azithromycin and metronidazole. - For the concern for aspiration pneumonia starting treating with IV Zosyn and IV doxycycline 100 mg twice daily. - Blood culture already has been collected before starting antibiotic. -Rapid COVID PCR test negative - Pending respiratory panel, sputum culture, urine Legionella and urine Streptococcus antigen. -Continue pulmonary toiletry and nasal cannula supplemental oxygen to keep O2 sat above 94%. -Right-sided pleuritic chest pain likely secondary to aspiration pneumonia and small trace pleural effusion.  Continue pain control Toradol 15 mg every 8 hour for 2 days. - Continue spirometry. - Continue Tessalon and Mucinex as needed -Continue cardiac monitoring.   Essential hypertension -Blood pressure is well-controlled. - At home patient takes amlodipine and losartan-hydrochlorothiazide combination. - Starting amlodipine 5 mg for now.  Based on blood pressure trend will resume losartan and hydrochlorothiazide. -Continue hydralazine as needed  Hypokalemia -Slightly low potassium 3.4. - Repleted with oral KCl 40 meq   History of thoracic aortic aneurysm -Patient denies any mid substernal chest pain with no no radiation to the back.  Stable.  Continue to monitor.  Hepatic steatosis -CT chest showed incidental finding of hepatic asteatosis.  Stable hepatic panel. - Continue to monitor liver function.  Aortic atherosclerosis -CT chest showed aortic atherosclerosis - At home patient is not any lipid-lowering agent.  Checking lipid panel.   DVT prophylaxis: Lovenox and SCD Code Status: Full code Diet: Heart healthy diet Family Communication: Discussed treatment plan with  patient Disposition Plan: Pending blood culture and sputum culture result.  Based on culture result will decide about appropriate antibiotic.  Plan to discharge to home next 2 to 3 days Consults:  none Admission status:   Inpatient, Telemetry bed  Severity of Illness: The appropriate patient status for this patient is INPATIENT. Inpatient status is judged to be reasonable and necessary in order to provide the required intensity of service to ensure the patient's safety. The patient's presenting symptoms, physical exam findings, and initial radiographic and laboratory data in the context of their chronic comorbidities is felt to place them at high risk for further clinical deterioration. Furthermore, it is not anticipated that the patient will be medically stable for discharge from the hospital within 2 midnights of admission.   * I certify that at the point of admission it is my clinical judgment that the patient will require inpatient hospital care spanning beyond 2 midnights from the  point of admission due to high intensity of service, high risk for further deterioration and high frequency of surveillance required.Marland Kitchen    Tereasa Coop, MD [facility]  How to contact the [facility] Attending or Consulting provider 7A - 7P or covering provider during after hours 7P -7A, for this patient.  Check the care team in [facility] and look for a) attending/consulting [facility] provider listed and b) the [facility] team listed Log into [email or web address] and use 's universal password to access. If you do not have the password, please contact the hospital operator. Locate the [facility] provider you are looking for under [facility] and page to a number that you can be directly reached. If you still have difficulty reaching the provider, please page the [facility] (Director on Call) for the Hospitalists listed on amion for assistance.  10/31/2022, 7:49 PM

## 2022-10-31 NOTE — ED Notes (Signed)
He has just left with [facility] and I have just phoned report to [place], Charity fundraiser at [employer].

## 2022-10-31 NOTE — Progress Notes (Signed)
Pharmacy Antibiotic Note  Douglas Carney is a 72 y.o. male admitted on 10/31/2022 with  aspiration pneumonia .  Pharmacy has been consulted for Zosyn dosing.  Ceftriaxone, Azithromycin and Metronidazole given in ED.  Changing to Zosyn and IV Doxycycline.  Plan: Zosyn 3.375 gm IV q8h (each infused over 4 hours) Doxycycline 100 mg IV q12h per MD Follow up culture data, clinical progress. Narrow antibiotics as able.  Height: 6\' 2"  (188 cm) Weight: 131.5 kg (290 lb) IBW/kg (Calculated) : 82.2  Temp (24hrs), Avg:99.4 F (37.4 C), Min:99.1 F (37.3 C), Max:99.5 F (37.5 C)  Recent Labs  Lab 10/31/22 1147 10/31/22 1613  WBC 14.1*  --   CREATININE 0.87  --   LATICACIDVEN  --  1.3    Estimated Creatinine Clearance: 113.9 mL/min (by C-G formula based on SCr of 0.87 mg/dL).    Allergies  Allergen Reactions   Contrast Media [Iodinated Contrast Media] Anaphylaxis   Meloxicam Diarrhea    Other Reaction(s): Other (See Comments)  GI upset    Antimicrobials this admission: Zosyn 8/31 >> Doxy IV 8/31 >> Azithro IV x 1 on 8/31 Ceftriaxone x 1 on 8/31 Metronidazole IV x 1 on 8/31  Dose adjustments this admission: n/a  Microbiology results: 8/31 blood: sent 8/31 COVID: negative 8/31 respiratory panel:  8/31 sputum:  Thank you for allowing pharmacy to be a part of this patient's care.  Dennie Fetters, [place] 10/31/2022 8:13 PM

## 2022-11-01 ENCOUNTER — Encounter (HOSPITAL_COMMUNITY): Payer: Self-pay | Admitting: Family Medicine

## 2022-11-01 DIAGNOSIS — J189 Pneumonia, unspecified organism: Secondary | ICD-10-CM | POA: Diagnosis not present

## 2022-11-01 DIAGNOSIS — I10 Essential (primary) hypertension: Secondary | ICD-10-CM | POA: Diagnosis not present

## 2022-11-01 DIAGNOSIS — I700 Atherosclerosis of aorta: Secondary | ICD-10-CM | POA: Diagnosis not present

## 2022-11-01 DIAGNOSIS — K760 Fatty (change of) liver, not elsewhere classified: Secondary | ICD-10-CM | POA: Diagnosis not present

## 2022-11-01 LAB — RESPIRATORY PANEL BY PCR

## 2022-11-01 LAB — CBC
HCT: 39.3 % (ref 39.0–52.0)
Hemoglobin: 13.6 g/dL (ref 13.0–17.0)
MCH: 32.2 pg (ref 26.0–34.0)
MCHC: 34.6 g/dL (ref 30.0–36.0)
MCV: 92.9 fL (ref 80.0–100.0)
Platelets: 148 10*3/uL — ABNORMAL LOW (ref 150–400)
RBC: 4.23 MIL/uL (ref 4.22–5.81)
RDW: 12.3 % (ref 11.5–15.5)
WBC: 11.6 10*3/uL — ABNORMAL HIGH (ref 4.0–10.5)
nRBC: 0 % (ref 0.0–0.2)

## 2022-11-01 LAB — COMPREHENSIVE METABOLIC PANEL
ALT: 21 U/L (ref 0–44)
AST: 12 U/L — ABNORMAL LOW (ref 15–41)
Albumin: 3.3 g/dL — ABNORMAL LOW (ref 3.5–5.0)
Alkaline Phosphatase: 53 U/L (ref 38–126)
Anion gap: 12 (ref 5–15)
BUN: 15 mg/dL (ref 8–23)
CO2: 25 mmol/L (ref 22–32)
Calcium: 8.6 mg/dL — ABNORMAL LOW (ref 8.9–10.3)
Chloride: 98 mmol/L (ref 98–111)
Creatinine, Ser: 1.14 mg/dL (ref 0.61–1.24)
GFR, Estimated: >60 mL/min (ref >60)
Glucose, Bld: 123 mg/dL — ABNORMAL HIGH (ref 70–99)
Potassium: 3.1 mmol/L — ABNORMAL LOW (ref 3.5–5.1)
Sodium: 135 mmol/L (ref 135–145)
Total Bilirubin: 1.1 mg/dL (ref 0.3–1.2)
Total Protein: 5.8 g/dL — ABNORMAL LOW (ref 6.5–8.1)

## 2022-11-01 LAB — LIPID PANEL
Cholesterol: 163 mg/dL (ref 0–200)
HDL: 36 mg/dL — ABNORMAL LOW (ref >40)
LDL Cholesterol: 105 mg/dL — ABNORMAL HIGH (ref 0–99)
Total CHOL/HDL Ratio: 4.5 ratio
Triglycerides: 112 mg/dL (ref <150)
VLDL: 22 mg/dL (ref 0–40)

## 2022-11-01 LAB — HIV ANTIBODY (ROUTINE TESTING W REFLEX): HIV Screen 4th Generation wRfx: NONREACTIVE

## 2022-11-01 MED ORDER — POTASSIUM CHLORIDE CRYS ER 20 MEQ PO TBCR
40.0000 meq | EXTENDED_RELEASE_TABLET | ORAL | Status: AC
  Administered 2022-11-01 (×2): 40 meq via ORAL
  Filled 2022-11-01 (×2): qty 2

## 2022-11-01 MED ORDER — SODIUM CHLORIDE 0.9 % IV SOLN
1.5000 g | Freq: Four times a day (QID) | INTRAVENOUS | Status: DC
  Filled 2022-11-01 (×2): qty 4

## 2022-11-01 MED ORDER — SODIUM CHLORIDE 0.9 % IV SOLN
3.0000 g | Freq: Four times a day (QID) | INTRAVENOUS | Status: DC
  Administered 2022-11-01 – 2022-11-02 (×4): 3 g via INTRAVENOUS
  Filled 2022-11-01 (×4): qty 8

## 2022-11-01 MED ORDER — DOXYCYCLINE HYCLATE 100 MG PO TABS
100.0000 mg | ORAL_TABLET | Freq: Two times a day (BID) | ORAL | Status: DC
  Administered 2022-11-01 – 2022-11-02 (×3): 100 mg via ORAL
  Filled 2022-11-01 (×3): qty 1

## 2022-11-01 NOTE — Progress Notes (Addendum)
Pharmacy Antibiotic Note  Douglas Carney is a 72 y.o. male admitted on 10/31/2022 with aspiration pneumonia.  Pharmacy has been consulted for Unasyn dosing.  Patient is afebrile with a WBC of 11.6, downtrending from 14.1 yesterday (8/31). Kidney function is okay with a CrCl is 86.9 mL/min.   Plan: Begin Unasyn IV 3 gm q6h Monitor renal function, CBC, and for signs of clinical improvement  Height: 6\' 2"  (188 cm) Weight: 131.5 kg (290 lb) IBW/kg (Calculated) : 82.2  Temp (24hrs), Avg:98.8 F (37.1 C), Min:98 F (36.7 C), Max:99.5 F (37.5 C)  Recent Labs  Lab 10/31/22 1147 10/31/22 1613 11/01/22 0249  WBC 14.1*  --  11.6*  CREATININE 0.87  --  1.14  LATICACIDVEN  --  1.3  --     Estimated Creatinine Clearance: 86.9 mL/min (by C-G formula based on SCr of 1.14 mg/dL).    Allergies  Allergen Reactions   Contrast Media [Iodinated Contrast Media] Anaphylaxis   Meloxicam Diarrhea    Other Reaction(s): Other (See Comments)  GI upset    Antimicrobials this admission: Azithromycin 8/31 x1  Ceftriaxone 8/31 x1 Flagyl 8/31 x1  Doxycycline 9/1 >>  Microbiology results: 8/31 BCx: NG <12 hrs 9/1 Respiratory Panel: negative 8/31 Sputum: sent   Thank you for allowing pharmacy to be a part of this patient's care.  Roslyn Smiling, PharmD PGY1 Pharmacy Resident 11/01/2022 9:48 AM

## 2022-11-01 NOTE — Plan of Care (Signed)
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
## 2022-11-01 NOTE — Progress Notes (Signed)
PROGRESS NOTE        PATIENT DETAILS Name: Douglas Carney Age: 72 y.o. Sex: male Date of Birth: 1950/03/03 Admit Date: 10/31/2022 Admitting Physician Briscoe Deutscher, MD [id number], Zollie Beckers, MD  Brief Summary: Patient is a 72 y.o.  male with history of HTN, HLD, BPH, OSA on CPAP who several days ago vomited while he was using the CPAP-and subsequently aspirated-started developing shortness of breath/right-sided chest pain-he subsequently presented to the ED on 8/31-was found to have PNA and subsequently admitted to the hospitalist service.  Significant events: 8/31>> admit to [facility]  Significant studies: 8/31>> CT chest: Airspace disease at both lung bases with air bronchograms.  Significant microbiology data: 8/31>> blood culture: No growth 8/31>> COVID PCR: Negative 9/01>> respiratory virus panel: No growth  Procedures: None  Consults: None  Subjective: Lying comfortably in bed-denies any chest pain or shortness of breath.  Feels much better.  Objective: Vitals: Blood pressure 129/80, pulse 71, temperature 98.4 F (36.9 C), resp. rate 18, height 6\' 2"  (1.88 m), weight 131.5 kg, SpO2 97%.   Exam: Gen Exam:Alert awake-not in any distress HEENT:atraumatic, normocephalic Chest: B/L clear to auscultation anteriorly CVS:S1S2 regular Abdomen:soft non tender, non distended Extremities:no edema Neurology: Non focal Skin: no rash  Pertinent Labs/Radiology:    Latest Ref Rng & Units 11/01/2022    2:49 AM 10/31/2022   11:47 AM 12/21/2020    7:20 PM  CBC  WBC 4.0 - 10.5 K/uL 11.6  14.1  11.7   Hemoglobin 13.0 - 17.0 g/dL 96.2  95.2  84.1   Hematocrit 39.0 - 52.0 % 39.3  43.4  45.9   Platelets 150 - 400 K/uL 148  174  146     Lab Results  Component Value Date   NA 135 11/01/2022   K 3.1 (L) 11/01/2022   CL 98 11/01/2022   CO2 25 11/01/2022      Assessment/Plan: Aspiration pneumonia Clinically improved-feels much better Afebrile-decreasing WBC  count Switch to Unasyn Follow cultures/clinical course But suspect if he continues to improve-probably home 9/2.  Hypokalemia Replete/recheck  HTN BP stable Amlodipine  HLD Statin  History of thoracic aortic aneurysm/aortic atherosclerosis Statin Stable for outpatient monitoring/follow-up by PCP/cardiology  Obesity: Estimated body mass index is 37.23 kg/m as calculated from the following:   Height as of this encounter: 6\' 2"  (1.88 m).   Weight as of this encounter: 131.5 kg.   Code status:   Code Status: Full Code   DVT Prophylaxis: enoxaparin (LOVENOX) injection 40 mg Start: 10/31/22 2200 SCDs Start: 10/31/22 1913   Family Communication: None at bedside   Disposition Plan: Status is: Inpatient Remains inpatient appropriate because: Severity of illness   Planned Discharge Destination:Home   Diet: Diet Order             Diet Heart Room service appropriate? Yes; Fluid consistency: Thin  Diet effective now                     Antimicrobial agents: Anti-infectives (From admission, onward)    Start     Dose/Rate Route Frequency Ordered Stop   10/31/22 2200  piperacillin-tazobactam (ZOSYN) IVPB 3.375 g        3.375 g 12.5 mL/hr over 240 Minutes Intravenous Every 8 hours 10/31/22 2013     10/31/22 2030  doxycycline (VIBRAMYCIN) 100 mg in sodium chloride 0.9 %  250 mL IVPB        100 mg 125 mL/hr over 120 Minutes Intravenous Every 12 hours 10/31/22 1915 11/04/22 0829   10/31/22 1530  cefTRIAXone (ROCEPHIN) 1 g in sodium chloride 0.9 % 100 mL IVPB        1 g 200 mL/hr over 30 Minutes Intravenous  Once 10/31/22 1519 10/31/22 1758   10/31/22 1530  azithromycin (ZITHROMAX) 500 mg in sodium chloride 0.9 % 250 mL IVPB        500 mg 250 mL/hr over 60 Minutes Intravenous  Once 10/31/22 1519 10/31/22 1758   10/31/22 1530  metroNIDAZOLE (FLAGYL) IVPB 500 mg        500 mg 100 mL/hr over 60 Minutes Intravenous  Once 10/31/22 1519 10/31/22 1758         MEDICATIONS: Scheduled Meds:  amLODipine  5 mg Oral Daily   enoxaparin (LOVENOX) injection  40 mg Subcutaneous Q24H   ketorolac  15 mg Intravenous Q8H   potassium chloride  40 mEq Oral Q4H   sodium chloride flush  3 mL Intravenous Q12H   Continuous Infusions:  sodium chloride Stopped (10/31/22 1758)   sodium chloride     doxycycline (VIBRAMYCIN) IV Stopped (11/01/22 0001)   piperacillin-tazobactam (ZOSYN)  IV 12.5 mL/hr at 11/01/22 0647   PRN Meds:.sodium chloride, sodium chloride, acetaminophen **OR** acetaminophen, benzonatate, guaiFENesin, hydrALAZINE, levalbuterol, ondansetron **OR** ondansetron (ZOFRAN) IV, senna-docusate, sodium chloride flush   I have personally reviewed following labs and imaging studies  LABORATORY DATA: CBC: Recent Labs  Lab 10/31/22 1147 11/01/22 0249  WBC 14.1* 11.6*  HGB 15.4 13.6  HCT 43.4 39.3  MCV 92.5 92.9  PLT 174 148*    Basic Metabolic Panel: Recent Labs  Lab 10/31/22 1147 11/01/22 0249  NA 135 135  K 3.4* 3.1*  CL 100 98  CO2 24 25  GLUCOSE 116* 123*  BUN 11 15  CREATININE 0.87 1.14  CALCIUM 8.9 8.6*    GFR: Estimated Creatinine Clearance: 86.9 mL/min (by C-G formula based on SCr of 1.14 mg/dL).  Liver Function Tests: Recent Labs  Lab 10/31/22 1338 11/01/22 0249  AST 10* 12*  ALT 20 21  ALKPHOS 62 53  BILITOT 1.2 1.1  PROT 6.6 5.8*  ALBUMIN 4.3 3.3*   No results for input(s): "LIPASE", "AMYLASE" in the last 168 hours. No results for input(s): "AMMONIA" in the last 168 hours.  Coagulation Profile: No results for input(s): "INR", "PROTIME" in the last 168 hours.  Cardiac Enzymes: No results for input(s): "CKTOTAL", "CKMB", "CKMBINDEX", "TROPONINI" in the last 168 hours.  BNP (last 3 results) No results for input(s): "PROBNP" in the last 8760 hours.  Lipid Profile: Recent Labs    11/01/22 0249  CHOL 163  HDL 36*  LDLCALC 105*  TRIG 112  CHOLHDL 4.5    Thyroid Function Tests: No results for  input(s): "TSH", "T4TOTAL", "FREET4", "T3FREE", "THYROIDAB" in the last 72 hours.  Anemia Panel: No results for input(s): "VITAMINB12", "FOLATE", "FERRITIN", "TIBC", "IRON", "RETICCTPCT" in the last 72 hours.  Urine analysis: No results found for: "COLORURINE", "APPEARANCEUR", "LABSPEC", "PHURINE", "GLUCOSEU", "HGBUR", "BILIRUBINUR", "KETONESUR", "PROTEINUR", "UROBILINOGEN", "NITRITE", "LEUKOCYTESUR"  Sepsis Labs: Lactic Acid, Venous    Component Value Date/Time   LATICACIDVEN 1.3 10/31/2022 1613    MICROBIOLOGY: Recent Results (from the past 240 hour(s))  Blood culture (routine x 2)     Status: None (Preliminary result)   Collection Time: 10/31/22  3:36 PM   Specimen: BLOOD RIGHT HAND  Result Value Ref  Range Status   Specimen Description   Final    BLOOD RIGHT HAND Performed at [facility], [address] [place], [place] [address]    Special Requests   Final    BOTTLES DRAWN AEROBIC AND ANAEROBIC Blood Culture results may not be optimal due to an inadequate volume of blood received in culture bottles Performed at [facility], [address], [place], [place] [address]    Culture   Final    NO GROWTH < 12 HOURS Performed at [facility], [address], [place], [place] [address]    Report Status PENDING  Incomplete  SARS Coronavirus 2 by RT PCR (hospital order, performed in [facility] hospital lab) *cepheid single result test* Anterior Nasal Swab     Status: None   Collection Time: 10/31/22  3:36 PM   Specimen: Anterior Nasal Swab  Result Value Ref Range Status   SARS Coronavirus 2 by RT PCR NEGATIVE NEGATIVE Final    Comment: (NOTE) SARS-CoV-2 target nucleic acids are NOT DETECTED.  The SARS-CoV-2 RNA is generally detectable in upper and lower respiratory specimens during the acute phase of infection. The lowest concentration of SARS-CoV-2 viral copies this assay can detect is 250 copies / mL. A negative result does not  preclude SARS-CoV-2 infection and should not be used as the sole basis for treatment or other patient management decisions.  A negative result may occur with improper specimen collection / handling, submission of specimen other than nasopharyngeal swab, presence of viral mutation(s) within the areas targeted by this assay, and inadequate number of viral copies (<250 copies / mL). A negative result must be combined with clinical observations, patient history, and epidemiological information.  Fact Sheet for Patients:   RoadLapTop.co.za  Fact Sheet for Healthcare Providers: http://kim-miller.com/  This test is not yet approved or  cleared by the [place] FDA and has been authorized for detection and/or diagnosis of SARS-CoV-2 by FDA under an Emergency Use Authorization (EUA).  This EUA will remain in effect (meaning this test can be used) for the duration of the COVID-19 declaration under Section 564(b)(1) of the Act, 21 U.S.C. section 360bbb-3(b)(1), unless the authorization is terminated or revoked sooner.  Performed at [facility], [address] [place], [place] [address]   Blood culture (routine x 2)     Status: None (Preliminary result)   Collection Time: 10/31/22  3:49 PM   Specimen: BLOOD  Result Value Ref Range Status   Specimen Description   Final    BLOOD RIGHT ANTECUBITAL Performed at [facility], [address] [place], [place] [address]    Special Requests   Final    BOTTLES DRAWN AEROBIC AND ANAEROBIC Blood Culture adequate volume Performed at [facility], [address] [place], [place] [address]    Culture   Final    NO GROWTH < 12 HOURS Performed at [facility], [address], [place], [place] [address]    Report Status PENDING  Incomplete  Respiratory (~20 pathogens) panel by PCR     Status: None   Collection Time: 11/01/22  3:00 AM    Specimen: Nasopharyngeal Swab; Respiratory  Result Value Ref Range Status   Adenovirus NOT DETECTED NOT DETECTED Final   Coronavirus 229E NOT DETECTED NOT DETECTED Final    Comment: (NOTE) The Coronavirus on the Respiratory Panel, DOES NOT test for the novel  Coronavirus (2019 nCoV)    Coronavirus HKU1 NOT DETECTED NOT DETECTED  Final   Coronavirus NL63 NOT DETECTED NOT DETECTED Final   Coronavirus OC43 NOT DETECTED NOT DETECTED Final   Metapneumovirus NOT DETECTED NOT DETECTED Final   Rhinovirus / Enterovirus NOT DETECTED NOT DETECTED Final   Influenza A NOT DETECTED NOT DETECTED Final   Influenza B NOT DETECTED NOT DETECTED Final   Parainfluenza Virus 1 NOT DETECTED NOT DETECTED Final   Parainfluenza Virus 2 NOT DETECTED NOT DETECTED Final   Parainfluenza Virus 3 NOT DETECTED NOT DETECTED Final   Parainfluenza Virus 4 NOT DETECTED NOT DETECTED Final   Respiratory Syncytial Virus NOT DETECTED NOT DETECTED Final   Bordetella pertussis NOT DETECTED NOT DETECTED Final   Bordetella Parapertussis NOT DETECTED NOT DETECTED Final   Chlamydophila pneumoniae NOT DETECTED NOT DETECTED Final   Mycoplasma pneumoniae NOT DETECTED NOT DETECTED Final    Comment: Performed at [facility], [address] [place], [place] [address]    RADIOLOGY STUDIES/RESULTS: CT Chest Wo Contrast  Result Date: 10/31/2022 CLINICAL DATA:  Chest wall pain, nontraumatic. Infection or inflammation suspected. Severe right chest pain, worse with inspiration. History of aneurysm and contrast allergy. EXAM: CT CHEST WITHOUT CONTRAST TECHNIQUE: Multidetector CT imaging of the chest was performed following the standard protocol without IV contrast. RADIATION DOSE REDUCTION: This exam was performed according to the departmental dose-optimization program which includes automated exposure control, adjustment of the mA and/or kV according to patient size and/or use of iterative reconstruction technique. COMPARISON:  Chest CT  09/08/2021. FINDINGS: Despite efforts by the technologist and patient, mild motion artifact is present on today's exam and could not be eliminated. This reduces exam sensitivity and specificity. Cardiovascular: No acute vascular findings on noncontrast imaging. There is mild aortic atherosclerosis. The heart size is normal. There is no pericardial effusion. Mediastinum/Nodes: There are no enlarged mediastinal, hilar or axillary lymph nodes.Hilar assessment is limited by the lack of intravenous contrast, although the hilar contours appear unchanged. The thyroid gland, trachea and esophagus demonstrate no significant findings. Lungs/Pleura: Trace right pleural effusion. No pneumothorax. New dependent airspace disease at both lung bases with associated air bronchograms. In addition, there are streaky opacities within the right middle lobe and lingula. Scattered small calcified granulomas are again noted. No suspicious pulmonary nodules. Upper abdomen: Diffuse low density throughout the liver consistent with steatosis. Previous cholecystectomy. No acute findings. Musculoskeletal/Chest wall: There is no chest wall mass or suspicious osseous finding. No evidence of rib fracture. There are prominent partially ankylosing osteophytes throughout the thoracic spine favoring diffuse idiopathic skeletal hyperostosis. Glenohumeral degenerative changes noted on the right. Unless specific follow-up recommendations are mentioned in the findings or impression sections, no imaging follow-up of any mentioned incidental findings is recommended. IMPRESSION: 1. New dependent airspace disease at both lung bases with associated air bronchograms, suspicious for pneumonia. Consider aspiration. Recommend chest radiographic follow-up. 2. Trace right pleural effusion. 3. No evidence of rib fracture or chest wall mass. 4. Hepatic steatosis. 5.  Aortic Atherosclerosis (ICD10-I70.0). Electronically Signed   By: Carey Bullocks M.D.   On: 10/31/2022  14:46   DG Chest 2 View  Result Date: 10/31/2022 CLINICAL DATA:  Chest pain pain EXAM: CHEST - 2 VIEW COMPARISON:  12/21/2020 FINDINGS: Low volume film with asymmetric elevation right hemidiaphragm. Bibasilar atelectasis or infiltrate noted with tiny bilateral effusions. No pulmonary edema. Cardiopericardial silhouette is at upper limits of normal for size. No acute bony abnormality. IMPRESSION: Low volume film with bibasilar atelectasis or infiltrate and tiny bilateral effusions. Electronically Signed   By:  Kennith Center M.D.   On: 10/31/2022 12:51     LOS: 1 day   Jeoffrey Massed, MD  [facility]    To contact the attending provider between 7A-7P or the covering provider during after hours 7P-7A, please log into the web site [email or web address] and access using universal [place] password for that web site. If you do not have the password, please call the hospital operator.  11/01/2022, 9:01 AM

## 2022-11-02 DIAGNOSIS — R079 Chest pain, unspecified: Secondary | ICD-10-CM | POA: Diagnosis not present

## 2022-11-02 DIAGNOSIS — E876 Hypokalemia: Secondary | ICD-10-CM | POA: Diagnosis not present

## 2022-11-02 DIAGNOSIS — J90 Pleural effusion, not elsewhere classified: Secondary | ICD-10-CM | POA: Diagnosis not present

## 2022-11-02 DIAGNOSIS — J189 Pneumonia, unspecified organism: Secondary | ICD-10-CM | POA: Diagnosis not present

## 2022-11-02 LAB — BASIC METABOLIC PANEL
Anion gap: 9 (ref 5–15)
BUN: 21 mg/dL (ref 8–23)
CO2: 23 mmol/L (ref 22–32)
Calcium: 8.1 mg/dL — ABNORMAL LOW (ref 8.9–10.3)
Chloride: 99 mmol/L (ref 98–111)
Creatinine, Ser: 1.13 mg/dL (ref 0.61–1.24)
GFR, Estimated: >60 mL/min (ref >60)
Glucose, Bld: 103 mg/dL — ABNORMAL HIGH (ref 70–99)
Potassium: 3.6 mmol/L (ref 3.5–5.1)
Sodium: 131 mmol/L — ABNORMAL LOW (ref 135–145)

## 2022-11-02 LAB — MAGNESIUM: Magnesium: 2 mg/dL (ref 1.7–2.4)

## 2022-11-02 LAB — CBC
HCT: 39 % (ref 39.0–52.0)
Hemoglobin: 13.4 g/dL (ref 13.0–17.0)
MCH: 32.9 pg (ref 26.0–34.0)
MCHC: 34.4 g/dL (ref 30.0–36.0)
MCV: 95.8 fL (ref 80.0–100.0)
Platelets: 153 10*3/uL (ref 150–400)
RBC: 4.07 MIL/uL — ABNORMAL LOW (ref 4.22–5.81)
RDW: 12.1 % (ref 11.5–15.5)
WBC: 10.3 10*3/uL (ref 4.0–10.5)
nRBC: 0 % (ref 0.0–0.2)

## 2022-11-02 MED ORDER — AMOXICILLIN-POT CLAVULANATE 875-125 MG PO TABS: 1.0000 | ORAL_TABLET | Freq: Two times a day (BID) | ORAL | 0 refills | Status: AC

## 2022-11-02 MED ORDER — BENZONATATE 100 MG PO CAPS: 100.0000 mg | ORAL_CAPSULE | Freq: Three times a day (TID) | ORAL | 0 refills | Status: DC | PRN

## 2022-11-02 NOTE — Plan of Care (Signed)

## 2022-11-02 NOTE — Discharge Summary (Signed)
PATIENT DETAILS Name: Douglas Carney Age: 72 y.o. Sex: male Date of Birth: Sep 25, 1950 MRN: [id number]. Admitting Physician: Briscoe Deutscher, MD [id number], Zollie Beckers, MD  Admit Date: 10/31/2022 Discharge date: 11/02/2022  Recommendations for Outpatient Follow-up:  Follow up with PCP in 1-2 weeks Please obtain CMP/CBC in one week Repeat two-view chest x-ray in 4 to 6 weeks to ensure radiographic resolution of pneumonia.  Admitted From:  Home  Disposition: Home   Discharge Condition: good  CODE STATUS:   Code Status: Full Code   Diet recommendation:  Diet Order             Diet - low sodium heart healthy           Diet Heart Room service appropriate? Yes; Fluid consistency: Thin  Diet effective now                    Brief Summary: Patient is a 72 y.o.  male with history of HTN, HLD, BPH, OSA on CPAP who several days ago vomited while he was using the CPAP-and subsequently aspirated-started developing shortness of breath/right-sided chest pain-he subsequently presented to the ED on 8/31-was found to have PNA and subsequently admitted to the hospitalist service.   Significant events: 8/31>> admit to [facility]   Significant studies: 8/31>> CT chest: Airspace disease at both lung bases with air bronchograms.   Significant microbiology data: 8/31>> blood culture: No growth 8/31>> COVID PCR: Negative 9/01>> respiratory virus panel: No growth   Procedures: None   Consults: None  Brief Hospital Course: Aspiration pneumonia Clinically improved-afebrile-on room air-leukocytosis has resolved Initially on Zosyn-switch to Unasyn-will be transition to Augmentin on discharge Cultures negative so far Since clinically much better-stable for discharge home today on oral antibiotics PCP to consider repeating two-view chest x-ray in 4 to 6 weeks to ensure resolution   Hypokalemia Replete/recheck   HTN BP stable Continue amlodipine-resume losartan/HCTZ on discharge    HLD Statin   History of thoracic aortic aneurysm/aortic atherosclerosis Statin Stable for outpatient monitoring/follow-up by PCP/cardiology  OSA CPAP nightly   Obesity: Estimated body mass index is 37.23 kg/m as calculated from the following:   Height as of this encounter: 6\' 2"  (1.88 m).   Weight as of this encounter: 131.5 kg.    Discharge Diagnoses:  Principal Problem:   Aspiration pneumonia [facility]) Active Problems:   Thoracic aortic aneurysm [facility])   Essential hypertension   Osteoarthritis   Hypokalemia   Hepatic steatosis   Aortic atherosclerosis (HCC)   Discharge Instructions:  Activity:  As tolerated  Discharge Instructions     Call MD for:  difficulty breathing, headache or visual disturbances   Complete by: As directed    Call MD for:  extreme fatigue   Complete by: As directed    Call MD for:  persistant dizziness or light-headedness   Complete by: As directed    Diet - low sodium heart healthy   Complete by: As directed    Discharge instructions   Complete by: As directed    Follow with Primary MD  Merri Brunette, MD in 1-2 weeks  Please get a complete blood count and chemistry panel checked by your Primary MD at your next visit, and again as instructed by your Primary MD.  Get Medicines reviewed and adjusted: Please take all your medications with you for your next visit with your Primary MD  Laboratory/radiological data: Please request your Primary MD to go over all hospital tests and procedure/radiological results at the  follow up, please ask your Primary MD to get all Hospital records sent to his/her office.  In some cases, they will be blood work, cultures and biopsy results pending at the time of your discharge. Please request that your primary care M.D. follows up on these results.  Also Note the following: If you experience worsening of your admission symptoms, develop shortness of breath, life threatening emergency, suicidal or homicidal  thoughts you must seek medical attention immediately by calling 911 or calling your MD immediately  if symptoms less severe.  You must read complete instructions/literature along with all the possible adverse reactions/side effects for all the Medicines you take and that have been prescribed to you. Take any new Medicines after you have completely understood and accpet all the possible adverse reactions/side effects.   Do not drive when taking Pain medications or sleeping medications (Benzodaizepines)  Do not take more than prescribed Pain, Sleep and Anxiety Medications. It is not advisable to combine anxiety,sleep and pain medications without talking with your primary care practitioner  Special Instructions: If you have smoked or chewed Tobacco  in the last 2 yrs please stop smoking, stop any regular Alcohol  and or any Recreational drug use.  Wear Seat belts while driving.  Please note: You were cared for by a hospitalist during your hospital stay. Once you are discharged, your primary care physician will handle any further medical issues. Please note that NO REFILLS for any discharge medications will be authorized once you are discharged, as it is imperative that you return to your primary care physician (or establish a relationship with a primary care physician if you do not have one) for your post hospital discharge needs so that they can reassess your need for medications and monitor your lab values.   Increase activity slowly   Complete by: As directed       Allergies as of 11/02/2022       Reactions   Contrast Media [iodinated Contrast Media] Anaphylaxis   Meloxicam Diarrhea   Other Reaction(s): Other (See Comments) GI upset        Medication List     TAKE these medications    amLODipine 5 MG tablet Commonly known as: NORVASC Take 5 mg by mouth daily.   amoxicillin-clavulanate 875-125 MG tablet Commonly known as: AUGMENTIN Take 1 tablet by mouth 2 (two) times daily for  5 days.   benzonatate 100 MG capsule Commonly known as: Tessalon Perles Take 1 capsule (100 mg total) by mouth 3 (three) times daily as needed for cough.   fluorouracil 5 % cream Commonly known as: EFUDEX Apply 1 Application topically 2 (two) times daily. For sun spot areas to both ears and right temple, PRN   losartan-hydrochlorothiazide 100-25 MG tablet Commonly known as: HYZAAR Take 1 tablet by mouth daily.        Allergies  Allergen Reactions   Contrast Media [Iodinated Contrast Media] Anaphylaxis   Meloxicam Diarrhea    Other Reaction(s): Other (See Comments)  GI upset     Other Procedures/Studies: CT Chest Wo Contrast  Result Date: 10/31/2022 CLINICAL DATA:  Chest wall pain, nontraumatic. Infection or inflammation suspected. Severe right chest pain, worse with inspiration. History of aneurysm and contrast allergy. EXAM: CT CHEST WITHOUT CONTRAST TECHNIQUE: Multidetector CT imaging of the chest was performed following the standard protocol without IV contrast. RADIATION DOSE REDUCTION: This exam was performed according to the departmental dose-optimization program which includes automated exposure control, adjustment of the mA and/or kV according  to patient size and/or use of iterative reconstruction technique. COMPARISON:  Chest CT 09/08/2021. FINDINGS: Despite efforts by the technologist and patient, mild motion artifact is present on today's exam and could not be eliminated. This reduces exam sensitivity and specificity. Cardiovascular: No acute vascular findings on noncontrast imaging. There is mild aortic atherosclerosis. The heart size is normal. There is no pericardial effusion. Mediastinum/Nodes: There are no enlarged mediastinal, hilar or axillary lymph nodes.Hilar assessment is limited by the lack of intravenous contrast, although the hilar contours appear unchanged. The thyroid gland, trachea and esophagus demonstrate no significant findings. Lungs/Pleura: Trace right  pleural effusion. No pneumothorax. New dependent airspace disease at both lung bases with associated air bronchograms. In addition, there are streaky opacities within the right middle lobe and lingula. Scattered small calcified granulomas are again noted. No suspicious pulmonary nodules. Upper abdomen: Diffuse low density throughout the liver consistent with steatosis. Previous cholecystectomy. No acute findings. Musculoskeletal/Chest wall: There is no chest wall mass or suspicious osseous finding. No evidence of rib fracture. There are prominent partially ankylosing osteophytes throughout the thoracic spine favoring diffuse idiopathic skeletal hyperostosis. Glenohumeral degenerative changes noted on the right. Unless specific follow-up recommendations are mentioned in the findings or impression sections, no imaging follow-up of any mentioned incidental findings is recommended. IMPRESSION: 1. New dependent airspace disease at both lung bases with associated air bronchograms, suspicious for pneumonia. Consider aspiration. Recommend chest radiographic follow-up. 2. Trace right pleural effusion. 3. No evidence of rib fracture or chest wall mass. 4. Hepatic steatosis. 5.  Aortic Atherosclerosis (ICD10-I70.0). Electronically Signed   By: Carey Bullocks M.D.   On: 10/31/2022 14:46   DG Chest 2 View  Result Date: 10/31/2022 CLINICAL DATA:  Chest pain pain EXAM: CHEST - 2 VIEW COMPARISON:  12/21/2020 FINDINGS: Low volume film with asymmetric elevation right hemidiaphragm. Bibasilar atelectasis or infiltrate noted with tiny bilateral effusions. No pulmonary edema. Cardiopericardial silhouette is at upper limits of normal for size. No acute bony abnormality. IMPRESSION: Low volume film with bibasilar atelectasis or infiltrate and tiny bilateral effusions. Electronically Signed   By: Kennith Center M.D.   On: 10/31/2022 12:51     TODAY-DAY OF DISCHARGE:  Subjective:   Blinda Leatherwood today has no headache,no chest  abdominal pain,no new weakness tingling or numbness, feels much better wants to go home today.   Objective:   Blood pressure 127/81, pulse 79, temperature 98.3 F (36.8 C), temperature source Oral, resp. rate 20, height 6\' 2"  (1.88 m), weight 131.5 kg, SpO2 95%.  Intake/Output Summary (Last 24 hours) at 11/02/2022 0824 Last data filed at 11/02/2022 0500 Gross per 24 hour  Intake 450.67 ml  Output --  Net 450.67 ml   Filed Weights   10/31/22 1129  Weight: 131.5 kg    Exam: Awake Alert, Oriented *3, No new F.N deficits, Normal affect .AT,PERRAL Supple Neck,No JVD, No cervical lymphadenopathy appriciated.  Symmetrical Chest wall movement, Good air movement bilaterally, CTAB RRR,No Gallops,Rubs or new Murmurs, No Parasternal Heave +ve B.Sounds, Abd Soft, Non tender, No organomegaly appriciated, No rebound -guarding or rigidity. No Cyanosis, Clubbing or edema, No new Rash or bruise   PERTINENT RADIOLOGIC STUDIES: CT Chest Wo Contrast  Result Date: 10/31/2022 CLINICAL DATA:  Chest wall pain, nontraumatic. Infection or inflammation suspected. Severe right chest pain, worse with inspiration. History of aneurysm and contrast allergy. EXAM: CT CHEST WITHOUT CONTRAST TECHNIQUE: Multidetector CT imaging of the chest was performed following the standard protocol without IV contrast. RADIATION DOSE REDUCTION: This  exam was performed according to the departmental dose-optimization program which includes automated exposure control, adjustment of the mA and/or kV according to patient size and/or use of iterative reconstruction technique. COMPARISON:  Chest CT 09/08/2021. FINDINGS: Despite efforts by the technologist and patient, mild motion artifact is present on today's exam and could not be eliminated. This reduces exam sensitivity and specificity. Cardiovascular: No acute vascular findings on noncontrast imaging. There is mild aortic atherosclerosis. The heart size is normal. There is no pericardial  effusion. Mediastinum/Nodes: There are no enlarged mediastinal, hilar or axillary lymph nodes.Hilar assessment is limited by the lack of intravenous contrast, although the hilar contours appear unchanged. The thyroid gland, trachea and esophagus demonstrate no significant findings. Lungs/Pleura: Trace right pleural effusion. No pneumothorax. New dependent airspace disease at both lung bases with associated air bronchograms. In addition, there are streaky opacities within the right middle lobe and lingula. Scattered small calcified granulomas are again noted. No suspicious pulmonary nodules. Upper abdomen: Diffuse low density throughout the liver consistent with steatosis. Previous cholecystectomy. No acute findings. Musculoskeletal/Chest wall: There is no chest wall mass or suspicious osseous finding. No evidence of rib fracture. There are prominent partially ankylosing osteophytes throughout the thoracic spine favoring diffuse idiopathic skeletal hyperostosis. Glenohumeral degenerative changes noted on the right. Unless specific follow-up recommendations are mentioned in the findings or impression sections, no imaging follow-up of any mentioned incidental findings is recommended. IMPRESSION: 1. New dependent airspace disease at both lung bases with associated air bronchograms, suspicious for pneumonia. Consider aspiration. Recommend chest radiographic follow-up. 2. Trace right pleural effusion. 3. No evidence of rib fracture or chest wall mass. 4. Hepatic steatosis. 5.  Aortic Atherosclerosis (ICD10-I70.0). Electronically Signed   By: Carey Bullocks M.D.   On: 10/31/2022 14:46   DG Chest 2 View  Result Date: 10/31/2022 CLINICAL DATA:  Chest pain pain EXAM: CHEST - 2 VIEW COMPARISON:  12/21/2020 FINDINGS: Low volume film with asymmetric elevation right hemidiaphragm. Bibasilar atelectasis or infiltrate noted with tiny bilateral effusions. No pulmonary edema. Cardiopericardial silhouette is at upper limits of  normal for size. No acute bony abnormality. IMPRESSION: Low volume film with bibasilar atelectasis or infiltrate and tiny bilateral effusions. Electronically Signed   By: Kennith Center M.D.   On: 10/31/2022 12:51     PERTINENT LAB RESULTS: CBC: Recent Labs    11/01/22 0249 11/02/22 0309  WBC 11.6* 10.3  HGB 13.6 13.4  HCT 39.3 39.0  PLT 148* 153   CMET CMP     Component Value Date/Time   NA 131 (L) 11/02/2022 0309   K 3.6 11/02/2022 0309   CL 99 11/02/2022 0309   CO2 23 11/02/2022 0309   GLUCOSE 103 (H) 11/02/2022 0309   BUN 21 11/02/2022 0309   CREATININE 1.13 11/02/2022 0309   CALCIUM 8.1 (L) 11/02/2022 0309   PROT 5.8 (L) 11/01/2022 0249   ALBUMIN 3.3 (L) 11/01/2022 0249   AST 12 (L) 11/01/2022 0249   ALT 21 11/01/2022 0249   ALKPHOS 53 11/01/2022 0249   BILITOT 1.1 11/01/2022 0249   GFRNONAA >60 11/02/2022 0309    GFR Estimated Creatinine Clearance: 87.7 mL/min (by C-G formula based on SCr of 1.13 mg/dL). No results for input(s): "LIPASE", "AMYLASE" in the last 72 hours. No results for input(s): "CKTOTAL", "CKMB", "CKMBINDEX", "TROPONINI" in the last 72 hours. Invalid input(s): "POCBNP" Recent Labs    10/31/22 1338  DDIMER 0.50   No results for input(s): "HGBA1C" in the last 72 hours. Recent Labs  11/01/22 0249  CHOL 163  HDL 36*  LDLCALC 105*  TRIG 112  CHOLHDL 4.5   No results for input(s): "TSH", "T4TOTAL", "T3FREE", "THYROIDAB" in the last 72 hours.  Invalid input(s): "FREET3" No results for input(s): "VITAMINB12", "FOLATE", "FERRITIN", "TIBC", "IRON", "RETICCTPCT" in the last 72 hours. Coags: No results for input(s): "INR" in the last 72 hours.  Invalid input(s): "PT" Microbiology: Recent Results (from the past 240 hour(s))  Blood culture (routine x 2)     Status: None (Preliminary result)   Collection Time: 10/31/22  3:36 PM   Specimen: BLOOD RIGHT HAND  Result Value Ref Range Status   Specimen Description   Final    BLOOD RIGHT  HAND Performed at [facility], [address], [place], [place] [address]    Special Requests   Final    BOTTLES DRAWN AEROBIC AND ANAEROBIC Blood Culture results may not be optimal due to an inadequate volume of blood received in culture bottles Performed at [facility], [address], [place], [place] [address]    Culture   Final    NO GROWTH 2 DAYS Performed at [facility], [address] [place], [place] [address]    Report Status PENDING  Incomplete  SARS Coronavirus 2 by RT PCR (hospital order, performed in [facility] hospital lab) *cepheid single result test* Anterior Nasal Swab     Status: None   Collection Time: 10/31/22  3:36 PM   Specimen: Anterior Nasal Swab  Result Value Ref Range Status   SARS Coronavirus 2 by RT PCR NEGATIVE NEGATIVE Final    Comment: (NOTE) SARS-CoV-2 target nucleic acids are NOT DETECTED.  The SARS-CoV-2 RNA is generally detectable in upper and lower respiratory specimens during the acute phase of infection. The lowest concentration of SARS-CoV-2 viral copies this assay can detect is 250 copies / mL. A negative result does not preclude SARS-CoV-2 infection and should not be used as the sole basis for treatment or other patient management decisions.  A negative result may occur with improper specimen collection / handling, submission of specimen other than nasopharyngeal swab, presence of viral mutation(s) within the areas targeted by this assay, and inadequate number of viral copies (<250 copies / mL). A negative result must be combined with clinical observations, patient history, and epidemiological information.  Fact Sheet for Patients:   RoadLapTop.co.za  Fact Sheet for Healthcare Providers: http://kim-miller.com/  This test is not yet approved or  cleared by the [place] FDA and has been authorized for detection and/or diagnosis of  SARS-CoV-2 by FDA under an Emergency Use Authorization (EUA).  This EUA will remain in effect (meaning this test can be used) for the duration of the COVID-19 declaration under Section 564(b)(1) of the Act, 21 U.S.C. section 360bbb-3(b)(1), unless the authorization is terminated or revoked sooner.  Performed at [facility], [address], [place], [place] [address]   Blood culture (routine x 2)     Status: None (Preliminary result)   Collection Time: 10/31/22  3:49 PM   Specimen: BLOOD  Result Value Ref Range Status   Specimen Description   Final    BLOOD RIGHT ANTECUBITAL Performed at [facility], [address], [place], [place] [address]    Special Requests   Final    BOTTLES DRAWN AEROBIC AND ANAEROBIC Blood Culture adequate volume Performed at [facility], [address], [place], [place] [address]    Culture   Final    NO  GROWTH 2 DAYS Performed at [facility], [address], [place], [place] [address]    Report Status PENDING  Incomplete  Respiratory (~20 pathogens) panel by PCR     Status: None   Collection Time: 11/01/22  3:00 AM   Specimen: Nasopharyngeal Swab; Respiratory  Result Value Ref Range Status   Adenovirus NOT DETECTED NOT DETECTED Final   Coronavirus 229E NOT DETECTED NOT DETECTED Final    Comment: (NOTE) The Coronavirus on the Respiratory Panel, DOES NOT test for the novel  Coronavirus (2019 nCoV)    Coronavirus HKU1 NOT DETECTED NOT DETECTED Final   Coronavirus NL63 NOT DETECTED NOT DETECTED Final   Coronavirus OC43 NOT DETECTED NOT DETECTED Final   Metapneumovirus NOT DETECTED NOT DETECTED Final   Rhinovirus / Enterovirus NOT DETECTED NOT DETECTED Final   Influenza A NOT DETECTED NOT DETECTED Final   Influenza B NOT DETECTED NOT DETECTED Final   Parainfluenza Virus 1 NOT DETECTED NOT DETECTED Final   Parainfluenza Virus 2 NOT DETECTED NOT DETECTED Final   Parainfluenza Virus 3  NOT DETECTED NOT DETECTED Final   Parainfluenza Virus 4 NOT DETECTED NOT DETECTED Final   Respiratory Syncytial Virus NOT DETECTED NOT DETECTED Final   Bordetella pertussis NOT DETECTED NOT DETECTED Final   Bordetella Parapertussis NOT DETECTED NOT DETECTED Final   Chlamydophila pneumoniae NOT DETECTED NOT DETECTED Final   Mycoplasma pneumoniae NOT DETECTED NOT DETECTED Final    Comment: Performed at [facility], [address], [place], [place] [address]    FURTHER DISCHARGE INSTRUCTIONS:  Get Medicines reviewed and adjusted: Please take all your medications with you for your next visit with your Primary MD  Laboratory/radiological data: Please request your Primary MD to go over all hospital tests and procedure/radiological results at the follow up, please ask your Primary MD to get all Hospital records sent to his/her office.  In some cases, they will be blood work, cultures and biopsy results pending at the time of your discharge. Please request that your primary care M.D. goes through all the records of your hospital data and follows up on these results.  Also Note the following: If you experience worsening of your admission symptoms, develop shortness of breath, life threatening emergency, suicidal or homicidal thoughts you must seek medical attention immediately by calling 911 or calling your MD immediately  if symptoms less severe.  You must read complete instructions/literature along with all the possible adverse reactions/side effects for all the Medicines you take and that have been prescribed to you. Take any new Medicines after you have completely understood and accpet all the possible adverse reactions/side effects.   Do not drive when taking Pain medications or sleeping medications (Benzodaizepines)  Do not take more than prescribed Pain, Sleep and Anxiety Medications. It is not advisable to combine anxiety,sleep and pain medications without talking with your primary  care practitioner  Special Instructions: If you have smoked or chewed Tobacco  in the last 2 yrs please stop smoking, stop any regular Alcohol  and or any Recreational drug use.  Wear Seat belts while driving.  Please note: You were cared for by a hospitalist during your hospital stay. Once you are discharged, your primary care physician will handle any further medical issues. Please note that NO REFILLS for any discharge medications will be authorized once you are discharged, as it is imperative that you return to your primary care physician (or establish a relationship with a primary care physician if you  do not have one) for your post hospital discharge needs so that they can reassess your need for medications and monitor your lab values.  Total Time spent coordinating discharge including counseling, education and face to face time equals greater than 30 minutes.  SignedJeoffrey Massed 11/02/2022 8:24 AM

## 2022-11-02 NOTE — Plan of Care (Signed)

## 2022-11-03 ENCOUNTER — Ambulatory Visit: Payer: Medicare HMO | Admitting: Internal Medicine

## 2022-11-03 ENCOUNTER — Ambulatory Visit (INDEPENDENT_AMBULATORY_CARE_PROVIDER_SITE_OTHER): Payer: Medicare HMO

## 2022-11-03 ENCOUNTER — Encounter: Payer: Self-pay | Admitting: Internal Medicine

## 2022-11-03 VITALS — BP 128/82 | HR 78 | Temp 97.2°F | Ht 74.0 in | Wt 298.0 lb

## 2022-11-03 DIAGNOSIS — J690 Pneumonitis due to inhalation of food and vomit: Secondary | ICD-10-CM

## 2022-11-03 DIAGNOSIS — R918 Other nonspecific abnormal finding of lung field: Secondary | ICD-10-CM | POA: Diagnosis not present

## 2022-11-03 DIAGNOSIS — G4733 Obstructive sleep apnea (adult) (pediatric): Secondary | ICD-10-CM | POA: Insufficient documentation

## 2022-11-03 NOTE — Patient Instructions (Signed)
Ok to wait until you feel ready to try CPAP again.  Order- CXR  dx aspiration pneumonia, R lateral pleuritic pain  Ok to use heat, cold and otc pain meds as needed  Please call if we can help

## 2022-11-03 NOTE — Assessment & Plan Note (Signed)
He was frightened by being startled awake when power failed with CPAP.  This triggered the reflux event and aspiration.  I would not blame him if he chose not to go back to CPAP but he indicates that he plans to try again in a week or 2.  Prior to this he was clearly benefiting with good compliance and control.

## 2022-11-03 NOTE — Assessment & Plan Note (Signed)
Residual localized pleuritic pain right mid axillary line.  Possible cracked rib. Plan-CXR, pain management as discussed.

## 2022-11-05 LAB — CULTURE, BLOOD (ROUTINE X 2)
Culture: NO GROWTH
Culture: NO GROWTH
Special Requests: ADEQUATE

## 2022-11-11 NOTE — Telephone Encounter (Signed)
I spoke with the patient and advised him he needs to schedule a shoulder CT before surgery can be scheduled. An order is in his chart for this. He was advised to contact CT scheduling at [facility]. Once he has a CT date, he will let me know so we can discuss scheduling surgery. Clearance still has not been received from Dr. Carolee Rota office. Patient states he has an appointment with them this Friday and will inquire about clearance.

## 2022-11-12 DIAGNOSIS — J189 Pneumonia, unspecified organism: Secondary | ICD-10-CM | POA: Diagnosis not present

## 2022-11-13 DIAGNOSIS — J690 Pneumonitis due to inhalation of food and vomit: Secondary | ICD-10-CM | POA: Diagnosis not present

## 2022-11-13 DIAGNOSIS — Z01818 Encounter for other preprocedural examination: Secondary | ICD-10-CM | POA: Diagnosis not present

## 2022-11-13 DIAGNOSIS — Z09 Encounter for follow-up examination after completed treatment for conditions other than malignant neoplasm: Secondary | ICD-10-CM | POA: Diagnosis not present

## 2022-11-13 DIAGNOSIS — E876 Hypokalemia: Secondary | ICD-10-CM | POA: Diagnosis not present

## 2022-12-11 ENCOUNTER — Other Ambulatory Visit (HOSPITAL_BASED_OUTPATIENT_CLINIC_OR_DEPARTMENT_OTHER): Payer: Self-pay | Admitting: Orthopaedic Surgery

## 2022-12-11 DIAGNOSIS — M19012 Primary osteoarthritis, left shoulder: Secondary | ICD-10-CM

## 2022-12-22 ENCOUNTER — Ambulatory Visit (HOSPITAL_BASED_OUTPATIENT_CLINIC_OR_DEPARTMENT_OTHER)
Admission: RE | Admit: 2022-12-22 | Discharge: 2022-12-22 | Disposition: A | Discharge status: Home / Self Care | Payer: Medicare HMO | Source: Ambulatory Visit | Attending: Orthopaedic Surgery | Admitting: Orthopaedic Surgery

## 2022-12-22 DIAGNOSIS — Z01818 Encounter for other preprocedural examination: Secondary | ICD-10-CM | POA: Diagnosis not present

## 2022-12-22 DIAGNOSIS — M19012 Primary osteoarthritis, left shoulder: Secondary | ICD-10-CM | POA: Diagnosis not present

## 2022-12-22 DIAGNOSIS — M25512 Pain in left shoulder: Secondary | ICD-10-CM | POA: Diagnosis not present

## 2022-12-22 DIAGNOSIS — M778 Other enthesopathies, not elsewhere classified: Secondary | ICD-10-CM | POA: Diagnosis not present

## 2022-12-23 DIAGNOSIS — H52203 Unspecified astigmatism, bilateral: Secondary | ICD-10-CM | POA: Diagnosis not present

## 2022-12-23 DIAGNOSIS — H2513 Age-related nuclear cataract, bilateral: Secondary | ICD-10-CM | POA: Diagnosis not present

## 2022-12-23 DIAGNOSIS — H5213 Myopia, bilateral: Secondary | ICD-10-CM | POA: Diagnosis not present

## 2022-12-23 DIAGNOSIS — H25013 Cortical age-related cataract, bilateral: Secondary | ICD-10-CM | POA: Diagnosis not present

## 2022-12-31 ENCOUNTER — Other Ambulatory Visit (HOSPITAL_BASED_OUTPATIENT_CLINIC_OR_DEPARTMENT_OTHER): Payer: Self-pay | Admitting: Orthopaedic Surgery

## 2022-12-31 ENCOUNTER — Encounter (HOSPITAL_BASED_OUTPATIENT_CLINIC_OR_DEPARTMENT_OTHER): Payer: Self-pay | Admitting: Orthopaedic Surgery

## 2022-12-31 DIAGNOSIS — M19012 Primary osteoarthritis, left shoulder: Secondary | ICD-10-CM

## 2023-01-14 DIAGNOSIS — L570 Actinic keratosis: Secondary | ICD-10-CM | POA: Diagnosis not present

## 2023-01-14 DIAGNOSIS — L814 Other melanin hyperpigmentation: Secondary | ICD-10-CM | POA: Diagnosis not present

## 2023-01-14 DIAGNOSIS — L821 Other seborrheic keratosis: Secondary | ICD-10-CM | POA: Diagnosis not present

## 2023-01-14 DIAGNOSIS — D225 Melanocytic nevi of trunk: Secondary | ICD-10-CM | POA: Diagnosis not present

## 2023-01-22 ENCOUNTER — Encounter (HOSPITAL_BASED_OUTPATIENT_CLINIC_OR_DEPARTMENT_OTHER): Payer: Self-pay | Admitting: Orthopaedic Surgery

## 2023-02-02 ENCOUNTER — Encounter (HOSPITAL_BASED_OUTPATIENT_CLINIC_OR_DEPARTMENT_OTHER): Payer: Self-pay | Admitting: Orthopaedic Surgery

## 2023-02-02 NOTE — Progress Notes (Signed)
Lvm with April at Dr. Serena Croissant office for updated cardiac clearance. Patient had clearance done in August of 2024 prior to aspiration pneumonia and hospitalization. We will need updated clearance for surgery.

## 2023-02-03 ENCOUNTER — Ambulatory Visit (HOSPITAL_BASED_OUTPATIENT_CLINIC_OR_DEPARTMENT_OTHER): Payer: Self-pay | Admitting: Orthopaedic Surgery

## 2023-02-03 ENCOUNTER — Encounter (HOSPITAL_BASED_OUTPATIENT_CLINIC_OR_DEPARTMENT_OTHER)
Admission: RE | Admit: 2023-02-03 | Discharge: 2023-02-03 | Disposition: A | Discharge status: Home / Self Care | Payer: Medicare HMO | Source: Ambulatory Visit | Attending: Orthopaedic Surgery | Admitting: Orthopaedic Surgery

## 2023-02-03 DIAGNOSIS — R9431 Abnormal electrocardiogram [ECG] [EKG]: Secondary | ICD-10-CM | POA: Insufficient documentation

## 2023-02-03 DIAGNOSIS — Z01812 Encounter for preprocedural laboratory examination: Secondary | ICD-10-CM | POA: Diagnosis present

## 2023-02-03 DIAGNOSIS — Z01810 Encounter for preprocedural cardiovascular examination: Secondary | ICD-10-CM | POA: Diagnosis present

## 2023-02-03 DIAGNOSIS — Z01818 Encounter for other preprocedural examination: Secondary | ICD-10-CM | POA: Insufficient documentation

## 2023-02-03 DIAGNOSIS — M19012 Primary osteoarthritis, left shoulder: Secondary | ICD-10-CM

## 2023-02-03 LAB — SURGICAL PCR SCREEN
MRSA, PCR: NEGATIVE
Staphylococcus aureus: NEGATIVE

## 2023-02-03 LAB — BASIC METABOLIC PANEL
Anion gap: 10 (ref 5–15)
BUN: 13 mg/dL (ref 8–23)
CO2: 23 mmol/L (ref 22–32)
Calcium: 9.1 mg/dL (ref 8.9–10.3)
Chloride: 105 mmol/L (ref 98–111)
Creatinine, Ser: 0.99 mg/dL (ref 0.61–1.24)
GFR, Estimated: >60 mL/min (ref >60)
Glucose, Bld: 97 mg/dL (ref 70–99)
Potassium: 4 mmol/L (ref 3.5–5.1)
Sodium: 138 mmol/L (ref 135–145)

## 2023-02-03 NOTE — Progress Notes (Signed)
    Enhanced Recovery after Surgery  Enhanced Recovery after Surgery is a protocol used to improve the stress on your body and your recovery after surgery.  Patient Instructions  The night before surgery:  No food after midnight. ONLY clear liquids after midnight  The day of surgery (if you do NOT have diabetes):  Drink ONE (1) Pre-Surgery Clear Ensure as directed.   This drink was given to you during your hospital  pre-op appointment visit. The pre-op nurse will instruct you on the time to drink the  Pre-Surgery Ensure depending on your surgery time. Finish the drink at the designated time by the pre-op nurse.  Nothing else to drink after completing the  Pre-Surgery Clear Ensure.  The day of surgery (if you have diabetes): Drink ONE (1) Gatorade 2 (G2) as directed. This drink was given to you during your hospital  pre-op appointment visit.  The pre-op nurse will instruct you on the time to drink the   Gatorade 2 (G2) depending on your surgery time. Color of the Gatorade may vary. Red is not allowed. Nothing else to drink after completing the  Gatorade 2 (G2).         If you have questions, please contact your surgeon's office.  Pre-surgery soap and benzoyl peroxide given as well and instructions for these. Patient verbalizes understanding.

## 2023-02-08 ENCOUNTER — Telehealth: Payer: Self-pay | Admitting: *Deleted

## 2023-02-08 NOTE — Care Plan (Signed)
[facility] RNCM call to patient to discuss his upcoming Left Reverse Shoulder arthroplasty with Dr. Steward Drone at [facility] on 02/09/23. Patient is agreeable to case management. He lives alone, but has a friend that will be coming to stay as needed. DME sling will be provided after surgery. Reviewed post op care instructions and questions answered. Patient already scheduled to start OPPT at [facility] on 02/15/23. Will continue to follow for needs.

## 2023-02-08 NOTE — Telephone Encounter (Signed)
[facility] pre-op call completed. 

## 2023-02-08 NOTE — Anesthesia Preprocedure Evaluation (Signed)
Anesthesia Evaluation  Patient identified by MRN, date of birth, ID band Patient awake    Reviewed: Allergy & Precautions, NPO status , Patient's Chart, lab work & pertinent test results  History of Anesthesia Complications Negative for: history of anesthetic complications  Airway Mallampati: II  TM Distance: >3 FB Neck ROM: Full    Dental  (+) Dental Advisory Given, Teeth Intact   Pulmonary sleep apnea    Pulmonary exam normal        Cardiovascular hypertension, Pt. on medications Normal cardiovascular exam     Neuro/Psych negative neurological ROS  negative psych ROS   GI/Hepatic negative GI ROS, Neg liver ROS,,,  Endo/Other   Obesity   Renal/GU negative Renal ROS     Musculoskeletal  (+) Arthritis ,    Abdominal   Peds  Hematology negative hematology ROS (+)   Anesthesia Other Findings   Reproductive/Obstetrics                              Anesthesia Physical Anesthesia Plan  ASA: 2  Anesthesia Plan: General   Post-op Pain Management: Tylenol PO (pre-op)* and Regional block*   Induction: Intravenous  PONV Risk Score and Plan: 2 and Treatment may vary due to age or medical condition, Ondansetron and TIVA  Airway Management Planned: Oral ETT  Additional Equipment: None  Intra-op Plan:   Post-operative Plan: Extubation in OR  Informed Consent: I have reviewed the patients History and Physical, chart, labs and discussed the procedure including the risks, benefits and alternatives for the proposed anesthesia with the patient or authorized representative who has indicated his/her understanding and acceptance.     Dental advisory given  Plan Discussed with: CRNA and Anesthesiologist  Anesthesia Plan Comments:         Anesthesia Quick Evaluation

## 2023-02-09 ENCOUNTER — Other Ambulatory Visit: Payer: Self-pay | Admitting: Surgery

## 2023-02-09 ENCOUNTER — Ambulatory Visit (HOSPITAL_BASED_OUTPATIENT_CLINIC_OR_DEPARTMENT_OTHER): Payer: Self-pay | Admitting: Anesthesiology

## 2023-02-09 ENCOUNTER — Other Ambulatory Visit: Payer: Self-pay

## 2023-02-09 ENCOUNTER — Encounter (HOSPITAL_BASED_OUTPATIENT_CLINIC_OR_DEPARTMENT_OTHER)
Admission: RE | Disposition: A | Discharge status: Home / Self Care | Payer: Self-pay | Source: Home / Self Care | Attending: Orthopaedic Surgery

## 2023-02-09 ENCOUNTER — Ambulatory Visit (HOSPITAL_COMMUNITY): Payer: Medicare HMO

## 2023-02-09 ENCOUNTER — Ambulatory Visit (HOSPITAL_BASED_OUTPATIENT_CLINIC_OR_DEPARTMENT_OTHER)
Admission: RE | Admit: 2023-02-09 | Discharge: 2023-02-09 | Disposition: A | Discharge status: Home / Self Care | Payer: Medicare HMO | Attending: Orthopaedic Surgery | Admitting: Orthopaedic Surgery

## 2023-02-09 ENCOUNTER — Encounter (HOSPITAL_BASED_OUTPATIENT_CLINIC_OR_DEPARTMENT_OTHER): Payer: Self-pay | Admitting: Orthopaedic Surgery

## 2023-02-09 DIAGNOSIS — M19012 Primary osteoarthritis, left shoulder: Secondary | ICD-10-CM | POA: Insufficient documentation

## 2023-02-09 DIAGNOSIS — E669 Obesity, unspecified: Secondary | ICD-10-CM | POA: Diagnosis not present

## 2023-02-09 DIAGNOSIS — G8918 Other acute postprocedural pain: Secondary | ICD-10-CM | POA: Diagnosis not present

## 2023-02-09 DIAGNOSIS — Z79899 Other long term (current) drug therapy: Secondary | ICD-10-CM | POA: Insufficient documentation

## 2023-02-09 DIAGNOSIS — Z96612 Presence of left artificial shoulder joint: Secondary | ICD-10-CM | POA: Diagnosis not present

## 2023-02-09 DIAGNOSIS — Z6837 Body mass index (BMI) 37.0-37.9, adult: Secondary | ICD-10-CM | POA: Insufficient documentation

## 2023-02-09 DIAGNOSIS — Z01818 Encounter for other preprocedural examination: Secondary | ICD-10-CM

## 2023-02-09 DIAGNOSIS — Z471 Aftercare following joint replacement surgery: Secondary | ICD-10-CM | POA: Diagnosis not present

## 2023-02-09 DIAGNOSIS — R58 Hemorrhage, not elsewhere classified: Secondary | ICD-10-CM

## 2023-02-09 DIAGNOSIS — I10 Essential (primary) hypertension: Secondary | ICD-10-CM | POA: Insufficient documentation

## 2023-02-09 DIAGNOSIS — G4730 Sleep apnea, unspecified: Secondary | ICD-10-CM | POA: Insufficient documentation

## 2023-02-09 DIAGNOSIS — I712 Thoracic aortic aneurysm, without rupture, unspecified: Secondary | ICD-10-CM

## 2023-02-09 HISTORY — PX: REVERSE SHOULDER ARTHROPLASTY: SHX5054

## 2023-02-09 SURGERY — ARTHROPLASTY, SHOULDER, TOTAL, REVERSE
Anesthesia: General | Site: Shoulder | Laterality: Left

## 2023-02-09 MED ORDER — PHENYLEPHRINE HCL (PRESSORS) 10 MG/ML IV SOLN
INTRAVENOUS | Status: DC | PRN
  Administered 2023-02-09: 150 ug via INTRAVENOUS
  Administered 2023-02-09: 100 ug via INTRAVENOUS
  Administered 2023-02-09 (×2): 200 ug via INTRAVENOUS
  Administered 2023-02-09: 100 ug via INTRAVENOUS
  Administered 2023-02-09: 150 ug via INTRAVENOUS
  Administered 2023-02-09: 300 ug via INTRAVENOUS
  Administered 2023-02-09: 150 ug via INTRAVENOUS
  Administered 2023-02-09 (×2): 100 ug via INTRAVENOUS
  Administered 2023-02-09: 200 ug via INTRAVENOUS
  Administered 2023-02-09: 150 ug via INTRAVENOUS
  Administered 2023-02-09: 100 ug via INTRAVENOUS

## 2023-02-09 MED ORDER — ACETAMINOPHEN 500 MG PO TABS: 1000.0000 mg | ORAL_TABLET | Freq: Once | ORAL | Status: DC

## 2023-02-09 MED ORDER — FENTANYL CITRATE (PF) 100 MCG/2ML IJ SOLN: 25.0000 ug | INTRAMUSCULAR | Status: DC | PRN

## 2023-02-09 MED ORDER — ATROPINE SULFATE 0.4 MG/ML IV SOLN
INTRAVENOUS | Status: AC
Start: 2023-02-09 — End: ?
  Filled 2023-02-09: qty 1

## 2023-02-09 MED ORDER — MIDAZOLAM HCL 2 MG/2ML IJ SOLN
INTRAMUSCULAR | Status: AC
  Filled 2023-02-09: qty 2

## 2023-02-09 MED ORDER — ONDANSETRON HCL 4 MG/2ML IJ SOLN
INTRAMUSCULAR | Status: AC
  Filled 2023-02-09: qty 2

## 2023-02-09 MED ORDER — SODIUM CHLORIDE 0.9 % IV SOLN
3.0000 g | INTRAVENOUS | Status: AC
  Administered 2023-02-09: 3 g via INTRAVENOUS

## 2023-02-09 MED ORDER — PROPOFOL 500 MG/50ML IV EMUL
INTRAVENOUS | Status: DC | PRN
  Administered 2023-02-09: 150 ug/kg/min via INTRAVENOUS

## 2023-02-09 MED ORDER — ONDANSETRON HCL 4 MG/2ML IJ SOLN: 4.0000 mg | Freq: Once | INTRAMUSCULAR | Status: DC | PRN

## 2023-02-09 MED ORDER — DIPHENHYDRAMINE HCL 50 MG/ML IJ SOLN
INTRAMUSCULAR | Status: DC | PRN
  Administered 2023-02-09: 25 mg via INTRAVENOUS

## 2023-02-09 MED ORDER — DEXMEDETOMIDINE HCL IN NACL 80 MCG/20ML IV SOLN
INTRAVENOUS | Status: AC
  Filled 2023-02-09: qty 20

## 2023-02-09 MED ORDER — BUPIVACAINE HCL (PF) 0.5 % IJ SOLN
INTRAMUSCULAR | Status: DC | PRN
  Administered 2023-02-09: 15 mL via PERINEURAL

## 2023-02-09 MED ORDER — ALBUMIN HUMAN 5 % IV SOLN
INTRAVENOUS | Status: AC
Start: 2023-02-09 — End: ?
  Filled 2023-02-09: qty 250

## 2023-02-09 MED ORDER — FENTANYL CITRATE (PF) 100 MCG/2ML IJ SOLN
INTRAMUSCULAR | Status: AC
  Filled 2023-02-09: qty 2

## 2023-02-09 MED ORDER — ACETAMINOPHEN 500 MG PO TABS: 500.0000 mg | ORAL_TABLET | Freq: Three times a day (TID) | ORAL | 0 refills | Status: AC

## 2023-02-09 MED ORDER — ALBUMIN HUMAN 5 % IV SOLN
12.5000 g | Freq: Once | INTRAVENOUS | Status: AC
  Administered 2023-02-09: 12.5 g via INTRAVENOUS

## 2023-02-09 MED ORDER — PHENYLEPHRINE 80 MCG/ML (10ML) SYRINGE FOR IV PUSH (FOR BLOOD PRESSURE SUPPORT)
PREFILLED_SYRINGE | INTRAVENOUS | Status: AC
  Filled 2023-02-09: qty 20

## 2023-02-09 MED ORDER — FENTANYL CITRATE (PF) 250 MCG/5ML IJ SOLN
INTRAMUSCULAR | Status: DC | PRN
  Administered 2023-02-09 (×2): 50 ug via INTRAVENOUS

## 2023-02-09 MED ORDER — ASPIRIN 325 MG PO TBEC: 325.0000 mg | DELAYED_RELEASE_TABLET | Freq: Every day | ORAL | 0 refills | Status: DC

## 2023-02-09 MED ORDER — PROPOFOL 10 MG/ML IV BOLUS
INTRAVENOUS | Status: AC
  Filled 2023-02-09: qty 20

## 2023-02-09 MED ORDER — DEXAMETHASONE SODIUM PHOSPHATE 10 MG/ML IJ SOLN
INTRAMUSCULAR | Status: AC
Start: 2023-02-09 — End: ?
  Filled 2023-02-09: qty 1

## 2023-02-09 MED ORDER — BUPIVACAINE HCL (PF) 0.25 % IJ SOLN
INTRAMUSCULAR | Status: AC
  Filled 2023-02-09: qty 30

## 2023-02-09 MED ORDER — FENTANYL CITRATE (PF) 100 MCG/2ML IJ SOLN
100.0000 ug | Freq: Once | INTRAMUSCULAR | Status: AC
  Administered 2023-02-09: 100 ug via INTRAVENOUS

## 2023-02-09 MED ORDER — TRANEXAMIC ACID-NACL 1000-0.7 MG/100ML-% IV SOLN
1000.0000 mg | INTRAVENOUS | Status: AC
  Administered 2023-02-09: 1000 mg via INTRAVENOUS

## 2023-02-09 MED ORDER — ACETAMINOPHEN 500 MG PO TABS
ORAL_TABLET | ORAL | Status: AC
  Filled 2023-02-09: qty 2

## 2023-02-09 MED ORDER — SUGAMMADEX SODIUM 200 MG/2ML IV SOLN
INTRAVENOUS | Status: DC | PRN
  Administered 2023-02-09: 350 mg via INTRAVENOUS

## 2023-02-09 MED ORDER — 0.9 % SODIUM CHLORIDE (POUR BTL) OPTIME
TOPICAL | Status: DC | PRN
  Administered 2023-02-09: 1000 mL

## 2023-02-09 MED ORDER — OXYCODONE HCL 5 MG/5ML PO SOLN: 5.0000 mg | Freq: Once | ORAL | Status: DC | PRN

## 2023-02-09 MED ORDER — SODIUM CHLORIDE 0.9 % IV SOLN
INTRAVENOUS | Status: AC
  Filled 2023-02-09: qty 3

## 2023-02-09 MED ORDER — ALBUTEROL SULFATE HFA 108 (90 BASE) MCG/ACT IN AERS
INHALATION_SPRAY | RESPIRATORY_TRACT | Status: AC
  Filled 2023-02-09: qty 6.7

## 2023-02-09 MED ORDER — OXYCODONE HCL 5 MG PO TABS: 5.0000 mg | ORAL_TABLET | ORAL | 0 refills | Status: DC | PRN

## 2023-02-09 MED ORDER — VANCOMYCIN HCL 1000 MG IV SOLR
INTRAVENOUS | Status: DC | PRN
  Administered 2023-02-09: 1000 mg via TOPICAL

## 2023-02-09 MED ORDER — ARTIFICIAL TEARS OPHTHALMIC OINT
TOPICAL_OINTMENT | OPHTHALMIC | Status: AC
  Filled 2023-02-09: qty 3.5

## 2023-02-09 MED ORDER — ONDANSETRON HCL 4 MG/2ML IJ SOLN
INTRAMUSCULAR | Status: DC | PRN
  Administered 2023-02-09: 4 mg via INTRAVENOUS

## 2023-02-09 MED ORDER — SODIUM CHLORIDE (PF) 0.9 % IJ SOLN
INTRAMUSCULAR | Status: AC
Start: 2023-02-09 — End: ?
  Filled 2023-02-09: qty 10

## 2023-02-09 MED ORDER — GLYCOPYRROLATE PF 0.2 MG/ML IJ SOSY
PREFILLED_SYRINGE | INTRAMUSCULAR | Status: AC
Start: 2023-02-09 — End: ?
  Filled 2023-02-09: qty 1

## 2023-02-09 MED ORDER — CEFAZOLIN SODIUM 1 G IJ SOLR
INTRAMUSCULAR | Status: AC
  Filled 2023-02-09: qty 20

## 2023-02-09 MED ORDER — EPHEDRINE SULFATE (PRESSORS) 50 MG/ML IJ SOLN: 10.0000 mg | Freq: Once | INTRAMUSCULAR | Status: DC

## 2023-02-09 MED ORDER — TRANEXAMIC ACID-NACL 1000-0.7 MG/100ML-% IV SOLN
INTRAVENOUS | Status: AC
  Filled 2023-02-09: qty 100

## 2023-02-09 MED ORDER — LACTATED RINGERS IV SOLN: INTRAVENOUS | Status: DC

## 2023-02-09 MED ORDER — DIPHENHYDRAMINE HCL 50 MG/ML IJ SOLN
INTRAMUSCULAR | Status: AC
  Filled 2023-02-09: qty 1

## 2023-02-09 MED ORDER — BUPIVACAINE LIPOSOME 1.3 % IJ SUSP
INTRAMUSCULAR | Status: DC | PRN
  Administered 2023-02-09: 10 mL via PERINEURAL

## 2023-02-09 MED ORDER — LIDOCAINE 2% (20 MG/ML) 5 ML SYRINGE
INTRAMUSCULAR | Status: DC | PRN
  Administered 2023-02-09: 100 mg via INTRAVENOUS

## 2023-02-09 MED ORDER — SODIUM CHLORIDE 0.9 % IV SOLN
INTRAVENOUS | Status: AC | PRN
  Administered 2023-02-09: 1000 mL

## 2023-02-09 MED ORDER — OXYCODONE HCL 5 MG PO TABS
5.0000 mg | ORAL_TABLET | Freq: Once | ORAL | Status: DC | PRN
Start: 2023-02-09 — End: 2023-02-09

## 2023-02-09 MED ORDER — ROCURONIUM BROMIDE 10 MG/ML (PF) SYRINGE
PREFILLED_SYRINGE | INTRAVENOUS | Status: AC
  Filled 2023-02-09: qty 10

## 2023-02-09 MED ORDER — GABAPENTIN 300 MG PO CAPS
ORAL_CAPSULE | ORAL | Status: AC
  Filled 2023-02-09: qty 1

## 2023-02-09 MED ORDER — VANCOMYCIN HCL 1000 MG IV SOLR
INTRAVENOUS | Status: AC
  Filled 2023-02-09: qty 20

## 2023-02-09 MED ORDER — LIDOCAINE 2% (20 MG/ML) 5 ML SYRINGE
INTRAMUSCULAR | Status: AC
Start: 2023-02-09 — End: ?
  Filled 2023-02-09: qty 5

## 2023-02-09 MED ORDER — SODIUM CHLORIDE 0.9 % IV SOLN: INTRAVENOUS | Status: DC | PRN

## 2023-02-09 MED ORDER — GABAPENTIN 300 MG PO CAPS
300.0000 mg | ORAL_CAPSULE | Freq: Once | ORAL | Status: AC
  Administered 2023-02-09: 300 mg via ORAL

## 2023-02-09 MED ORDER — PROPOFOL 10 MG/ML IV BOLUS
INTRAVENOUS | Status: DC | PRN
  Administered 2023-02-09: 150 mg via INTRAVENOUS

## 2023-02-09 MED ORDER — ROCURONIUM BROMIDE 10 MG/ML (PF) SYRINGE
PREFILLED_SYRINGE | INTRAVENOUS | Status: DC | PRN
  Administered 2023-02-09: 20 mg via INTRAVENOUS
  Administered 2023-02-09: 50 mg via INTRAVENOUS

## 2023-02-09 MED ORDER — SUCCINYLCHOLINE CHLORIDE 200 MG/10ML IV SOSY
PREFILLED_SYRINGE | INTRAVENOUS | Status: AC
  Filled 2023-02-09: qty 20

## 2023-02-09 MED ORDER — ACETAMINOPHEN 500 MG PO TABS
1000.0000 mg | ORAL_TABLET | Freq: Once | ORAL | Status: AC
Start: 2023-02-09 — End: 2023-02-09
  Administered 2023-02-09: 1000 mg via ORAL

## 2023-02-09 SURGICAL SUPPLY — 66 items
BASEPLATE SHOULDER FW 15D 29 (Joint) IMPLANT
BIT DRILL 3.2 PERIPHERAL SCREW (BIT) IMPLANT
BLADE SAW SGTL 73X25 THK (BLADE) ×1 IMPLANT
BLADE SURG 10 STRL SS (BLADE) IMPLANT
BLADE SURG 15 STRL LF DISP TIS (BLADE) IMPLANT
BRUSH SCRUB EZ PLAIN DRY (MISCELLANEOUS) IMPLANT
CHLORAPREP W/TINT 26 (MISCELLANEOUS) ×1 IMPLANT
CLSR STERI-STRIP ANTIMIC 1/2X4 (GAUZE/BANDAGES/DRESSINGS) IMPLANT
COOLER ICEMAN CLASSIC (MISCELLANEOUS) ×1 IMPLANT
COVER BACK TABLE 60X90IN (DRAPES) ×1 IMPLANT
COVER MAYO STAND STRL (DRAPES) ×1 IMPLANT
CUP HUM INSERT SHLD 3/4 39 +0 (Cup) IMPLANT
DERMABOND ADVANCED .7 DNX12 (GAUZE/BANDAGES/DRESSINGS) IMPLANT
DRAPE IMP U-DRAPE 54X76 (DRAPES) IMPLANT
DRAPE INCISE IOBAN 66X45 STRL (DRAPES) ×1 IMPLANT
DRAPE POUCH INSTRU U-SHP 10X18 (DRAPES) ×1 IMPLANT
DRAPE U-SHAPE 47X51 STRL (DRAPES) ×2 IMPLANT
DRAPE U-SHAPE 76X120 STRL (DRAPES) ×2 IMPLANT
DRSG AQUACEL AG ADV 3.5X10 (GAUZE/BANDAGES/DRESSINGS) ×1 IMPLANT
ELECT BLADE 4.0 EZ CLEAN MEGAD (MISCELLANEOUS) ×1 IMPLANT
ELECT REM PT RETURN 9FT ADLT (ELECTROSURGICAL) ×1 IMPLANT
ELECTRODE BLDE 4.0 EZ CLN MEGD (MISCELLANEOUS) ×1 IMPLANT
ELECTRODE REM PT RTRN 9FT ADLT (ELECTROSURGICAL) ×1 IMPLANT
GAUZE XEROFORM 1X8 LF (GAUZE/BANDAGES/DRESSINGS) IMPLANT
GLENOSPHERE STANDARD 39 (Joint) ×1 IMPLANT
GLENOSPHERE STD 39 (Joint) IMPLANT
GLOVE BIO SURGEON STRL SZ 6 (GLOVE) ×2 IMPLANT
GLOVE BIO SURGEON STRL SZ7 (GLOVE) IMPLANT
GLOVE BIO SURGEON STRL SZ7.5 (GLOVE) ×2 IMPLANT
GLOVE BIOGEL PI IND STRL 6.5 (GLOVE) ×1 IMPLANT
GLOVE BIOGEL PI IND STRL 7.0 (GLOVE) IMPLANT
GLOVE BIOGEL PI IND STRL 7.5 (GLOVE) IMPLANT
GLOVE BIOGEL PI IND STRL 8 (GLOVE) ×1 IMPLANT
GOWN STRL REUS W/ TWL LRG LVL3 (GOWN DISPOSABLE) ×2 IMPLANT
GOWN STRL REUS W/ TWL XL LVL3 (GOWN DISPOSABLE) IMPLANT
GOWN STRL REUS W/TWL XL LVL3 (GOWN DISPOSABLE) ×1 IMPLANT
GUIDE PIN 3X75 SHOULDER (PIN) ×2 IMPLANT
GUIDEWIRE GLENOID 2.5X220 (WIRE) IMPLANT
KIT SHOULDER STAB MARCO (KITS) ×1 IMPLANT
MANIFOLD NEPTUNE II (INSTRUMENTS) ×1 IMPLANT
PACK BASIN DAY SURGERY FS (CUSTOM PROCEDURE TRAY) ×1 IMPLANT
PACK SHOULDER (CUSTOM PROCEDURE TRAY) ×1 IMPLANT
PAD COLD SHLDR WRAP-ON (PAD) ×1 IMPLANT
PIN GUIDE 3X75 SHOULDER (PIN) IMPLANT
RESTRAINT HEAD UNIVERSAL NS (MISCELLANEOUS) ×1 IMPLANT
SCREW 5.5X22 (Screw) IMPLANT
SCREW BONE INTRNL SM 7 (Screw) IMPLANT
SCREW PERIPHERAL 30 (Screw) IMPLANT
SCREW PERIPHERAL 5.0X34 (Screw) IMPLANT
SET HNDPC FAN SPRY TIP SCT (DISPOSABLE) ×1 IMPLANT
SHEET MEDIUM DRAPE 40X70 STRL (DRAPES) ×1 IMPLANT
SLEEVE SCD COMPRESS KNEE MED (STOCKING) ×1 IMPLANT
SLING ARM FOAM STRAP XLG (SOFTGOODS) IMPLANT
SPIKE FLUID TRANSFER (MISCELLANEOUS) IMPLANT
SPONGE T-LAP 18X18 ~~LOC~~+RFID (SPONGE) ×1 IMPLANT
STEM HUM PERFORM 3+ (Stem) IMPLANT
SUCTION TUBE FRAZIER 10FR DISP (SUCTIONS) ×1 IMPLANT
SUT ETHIBOND 2 V 37 (SUTURE) ×1 IMPLANT
SUT ETHILON 3 0 PS 1 (SUTURE) IMPLANT
SUT MNCRL AB 3-0 PS2 27 (SUTURE) ×1 IMPLANT
SUT VIC AB 0 CT1 27XBRD ANBCTR (SUTURE) ×2 IMPLANT
SUT VIC AB 2-0 CT1 TAPERPNT 27 (SUTURE) ×2 IMPLANT
SUT VIC AB 3-0 SH 27X BRD (SUTURE) IMPLANT
SYR 50ML LL SCALE MARK (SYRINGE) ×1 IMPLANT
TOWEL GREEN STERILE FF (TOWEL DISPOSABLE) ×3 IMPLANT
TUBE SUCTION HIGH CAP CLEAR NV (SUCTIONS) ×1 IMPLANT

## 2023-02-09 NOTE — Anesthesia Postprocedure Evaluation (Signed)
Anesthesia Post Note  Patient: Douglas Carney  Procedure(s) Performed: LEFT REVERSE SHOULDER ARTHROPLASTY (Left: Shoulder)     Patient location during evaluation: PACU Anesthesia Type: General Level of consciousness: awake and alert Pain management: pain level controlled Vital Signs Assessment: post-procedure vital signs reviewed and stable Respiratory status: spontaneous breathing, nonlabored ventilation and respiratory function stable Cardiovascular status: blood pressure returned to baseline and stable Postop Assessment: no apparent nausea or vomiting Anesthetic complications: no Comments: Had a brief episode of hypotension and bradycardia after having been stable in PACU for some time, resolved with ephedrine and albumin.    No notable events documented.  Last Vitals:  Vitals:   02/09/23 1100 02/09/23 1115  BP: (!) 143/63 (!) 150/72  Pulse: 60 64  Resp: (!) 9 16  Temp:    SpO2: 100% 100%    Last Pain:  Vitals:   02/09/23 1100  TempSrc:   PainSc: 0-No pain                 Beryle Lathe

## 2023-02-09 NOTE — Brief Op Note (Signed)
   Brief Op Note  Date of Surgery: 02/09/2023  Preoperative Diagnosis: LEFT SHOULDER OSTEOARTHRITIS  Postoperative Diagnosis: same  Procedure: Procedure(s): LEFT REVERSE SHOULDER ARTHROPLASTY  Implants: Implant Name Type Inv. Item Serial No. Manufacturer Lot No. LRB No. Used Action  SCREW BONE INTRNL SM 7 - [id number] Screw SCREW BONE INTRNL SM 7 [id number] [employer]  Left 1 Implanted  BASEPLATE SHOULDER FW 15D 29 - [id number] Joint BASEPLATE SHOULDER FW 15D 29 [id number] [employer]  Left 1 Implanted  GLENOSPHERE STANDARD 39 - [id number] Joint GLENOSPHERE STANDARD 39 [id number] [employer]  Left 1 Implanted  STEM HUM PERFORM 3+ - [id number] Stem STEM HUM PERFORM 3+ [id number] [employer]  Left 1 Implanted  CUP HUM INSERT SHLD 3/4 39 +0 - [id number] Cup CUP HUM INSERT SHLD 3/4 39 +0 [id number] [employer]  Left 1 Implanted  SCREW 5.5X22 - [id number] Screw SCREW 5.5X22  [employer] ON STERILE SET Left 1 Implanted  SCREW PERIPHERAL 30 - [id number] Screw SCREW PERIPHERAL 30  [employer] ON STERILE SET Left 2 Implanted  SCREW PERIPHERAL 5.0X34 - [id number] Screw SCREW PERIPHERAL 5.0X34  [employer] ON STERILE SET Left 1 Implanted    Surgeons: Surgeon(s): Huel Cote, MD  Anesthesia: General    Estimated Blood Loss: See anesthesia record  Complications: None  Condition to PACU: Stable  Benancio Deeds, MD 02/09/2023 9:07 AM

## 2023-02-09 NOTE — Anesthesia Procedure Notes (Signed)
Anesthesia Regional Block: Interscalene brachial plexus block   Pre-Anesthetic Checklist: , timeout performed,  Correct Patient, Correct Site, Correct Laterality,  Correct Procedure, Correct Position, site marked,  Risks and benefits discussed,  Surgical consent,  Pre-op evaluation,  At surgeon's request and post-op pain management  Laterality: Left  Prep: chloraprep       Needles:  Injection technique: Single-shot  Needle Type: Echogenic Needle     Needle Length: 5cm  Needle Gauge: 21     Additional Needles:   Narrative:  Start time: 02/09/2023 7:12 AM End time: 02/09/2023 7:15 AM Injection made incrementally with aspirations every 5 mL.  Performed by: Personally  Anesthesiologist: Beryle Lathe, MD  Additional Notes: No pain on injection. No increased resistance to injection. Injection made in 5cc increments. Good needle visualization. Patient tolerated the procedure well.

## 2023-02-09 NOTE — H&P (Signed)
Chief Complaint: Left shoulder pain        History of Present Illness:    10/05/2022: Presents today for left shoulder injection.  He is planning on having surgery in December or January of this year.   Douglas Carney is a 72 y.o. male presents with ongoing multiple years of left shoulder pain which has been significantly worse this last year.  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  Does have a history of issues with the right shoulder previously managed by Dr. Carney Living in [place].  He is status post right proximal humerus fracture treatment.  Overall his left side is his nondominant side but has been quite symptomatic compared to the right.  He has not previously had any injections.  He has been working in physical therapy for the left shoulder with limited relief.  He is here today for further assessment       Surgical History:   None   PMH/PSH/Family History/Social History/Meds/Allergies:         Past Medical History:  Diagnosis Date   Arthritis      right hip   BPH (benign prostatic hyperplasia)     Cancer (HCC) 2005    melanoma removed right shoulder   COVID 1-18-2-22    fever chills body aches and cough x 5 days all symptoms resolved   Family history of adverse reaction to anesthesia      mother had brain fog after anesthesia lasted 4 to 5 dsys   Hypertension     Syncope     Wears glasses               Past Surgical History:  Procedure Laterality Date   APPENDECTOMY       right shoulder fracture surgery   07/14/2003   right shoulder pins removed   2027    wires and screws also removed   TONSILLECTOMY   age 25    and adenoids removed   TRANSURETHRAL RESECTION OF PROSTATE N/A 04/12/2020    Procedure: TRANSURETHRAL RESECTION OF THE PROSTATE (TURP);  Surgeon: Bjorn Pippin, MD;  Location: [facility];  Service: Urology;  Laterality: N/A;        Social History          Socioeconomic History   Marital status: Widowed      Spouse name: Not on file   Number of children: Not on file   Years of education: Not on file   Highest education level: Not on file  Occupational History   Occupation: Textiles  Tobacco Use   Smoking status: Never   Smokeless tobacco: Never  Vaping Use   Vaping status: Never Used  Substance and Sexual Activity   Alcohol use: Yes      Comment: rare   Drug use: Not Currently   Sexual activity: Not on file  Other Topics Concern   Not on file  Social History Narrative   Not on file    Social Determinants of Health        Financial Resource Strain: Low Risk  (07/20/2022)    Received from [facility], [facility]    Overall Financial Resource Strain (CARDIA)     Difficulty of Paying Living Expenses: Not hard at all  Food Insecurity: No Food Insecurity (07/20/2022)    Received from [facility], [facility]  Hunger Vital Sign     Worried About Running Out of Food in the Last Year: Never true     Ran Out of Food in the Last Year: Never true  Transportation Needs: No Transportation Needs (07/20/2022)    Received from [facility], [facility]    [facility] - Transportation     In the past 12 months, has lack of transportation kept you from medical appointments or from getting medications?: No     Lack of Transportation (Non-Medical): No  Physical Activity: Not on file  Stress: Not on file  Social Connections: Not on file         Family History  Problem Relation Age of Onset   Lymphoma Mother     Cancer Father 41        Melanoma        Allergies       Allergies  Allergen Reactions   Contrast Media [Iodinated Contrast Media] Anaphylaxis   Meloxicam Diarrhea      Other Reaction(s): Other (See Comments)   GI upset            Current Outpatient Medications  Medication Sig Dispense Refill   amLODipine  (NORVASC) 5 MG tablet Take 5 mg by mouth daily.       fluorouracil (EFUDEX) 5 % cream Apply topically 2 (two) times daily. For sun spot areas to both ears and right temple       olmesartan (BENICAR) 40 MG tablet Take 40 mg by mouth daily.          No current facility-administered medications for this visit.      Imaging Results (Last 48 hours)  No results found.     Review of Systems:   A ROS was performed including pertinent positives and negatives as documented in the HPI.   Physical Exam :   Constitutional: NAD and appears stated age Neurological: Alert and oriented Psych: Appropriate affect and cooperative There were no vitals taken for this visit.    Comprehensive Musculoskeletal Exam:     Musculoskeletal Exam      Inspection Right Left  Skin No atrophy or winging No atrophy or winging  Palpation      Tenderness None Left glenohumeral  Range of Motion      Flexion (passive) 170 90  Flexion (active) 170 70  Abduction 170 70  ER at the side 70 10  Can reach behind back to T12 Back pocket  Strength        none Limited due to pain  Special Tests      Pseudoparalytic No No  Neurologic      Fires PIN, radial, median, ulnar, musculocutaneous, axillary, suprascapular, long thoracic, and spinal accessory innervated muscles. No abnormal sensibility  Vascular/Lymphatic      Radial Pulse 2+ 2+  Cervical Exam      Patient has symmetric cervical range of motion with negative Spurling's test.  Special Test:         Imaging:   Xray (3 views left shoulder): Advanced glenohumeral osteoarthritis       I personally reviewed and interpreted the radiographs.     Assessment:   72 y.o. male with left shoulder advanced glenohumeral osteoarthritis.  This time he has trialed physical therapy without any relief.  I did discuss that we could perform a shoulder injection although I do believe that this would likely only give a couple months of transient relief.  He does have a  very  prominent crepitus and pain with overhead motion.  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  Side effect we did discuss the role for left shoulder arthroplasty.  Specifically I did discuss that I would recommend reverse shoulder arthroplasty at this time.  I discussed the specific limitations and restrictions associated with this.  After long discussion he is elected for left shoulder arthroplasty   Plan :     -Plan for left shoulder reverse shoulder arthroplasty     Left ultrasound-guided injection performed with verbal consent obtained      Procedure Note   Patient: Douglas Carney                                                          Date of Birth: 07/07/1950                                                   MRN: [id number]                                                               Visit Date: 10/05/2022   Procedures: Visit Diagnoses: No diagnosis found.   Large Joint Inj: L glenohumeral on 10/05/2022 11:26 AM Indications: pain Details: 22 G 1.5 in needle, ultrasound-guided anterior approach   Arthrogram: No   Medications: 4 mL lidocaine 1 %; 80 mg triamcinolone acetonide 40 MG/ML Outcome: tolerated well, no immediate complications Procedure, treatment alternatives, risks and benefits explained, specific risks discussed. Consent was given by the patient. Immediately prior to procedure a time out was called to verify the correct patient, procedure, equipment, support staff and site/side marked as required. Patient was prepped and draped in the usual sterile fashion.              After a lengthy discussion of treatment options, including risks, benefits, alternatives, complications of surgical and nonsurgical conservative options, the patient elected surgical repair.    The patient  is aware of the material risks  and complications including, but not limited to injury to adjacent structures, neurovascular injury, infection, numbness, bleeding, implant  failure, thermal burns, stiffness, persistent pain, failure to heal, disease transmission from allograft, need for further surgery, dislocation, anesthetic risks, blood clots, risks of death,and others. The probabilities of surgical success and failure discussed with patient given their particular co-morbidities.The time and nature of expected rehabilitation and recovery was discussed.The patient's questions were all answered preoperatively.  No barriers to understanding were noted. I explained the natural history of the disease process and Rx rationale.  I explained to the patient what I considered to be reasonable expectations given their personal situation.  The final treatment plan was arrived at through a shared patient decision making process model.           I personally saw and evaluated the patient, and participated in the management and treatment plan.   Huel Cote, MD Attending Physician, Orthopedic Surgery  This document was dictated using Conservation officer, historic buildings. A reasonable attempt at proof reading has been made to minimize errors.

## 2023-02-09 NOTE — Progress Notes (Signed)
Assisted Dr. Brock with left, interscalene , ultrasound guided block. Side rails up, monitors on throughout procedure. See vital signs in flow sheet. Tolerated Procedure well. 

## 2023-02-09 NOTE — Interval H&P Note (Signed)
History and Physical Interval Note:  02/09/2023 7:00 AM  Douglas Carney  has presented today for surgery, with the diagnosis of LEFT SHOULDER OSTEOARTHRITIS.  The various methods of treatment have been discussed with the patient and family. After consideration of risks, benefits and other options for treatment, the patient has consented to  Procedure(s): LEFT REVERSE SHOULDER ARTHROPLASTY (Left) as a surgical intervention.  The patient's history has been reviewed, patient examined, no change in status, stable for surgery.  I have reviewed the patient's chart and labs.  Questions were answered to the patient's satisfaction.     Huel Cote

## 2023-02-09 NOTE — Anesthesia Procedure Notes (Signed)
Procedure Name: Intubation Date/Time: 02/09/2023 7:40 AM  Performed by: Roosvelt Harps, CRNAPre-anesthesia Checklist: Patient identified, Emergency Drugs available, Suction available, Patient being monitored and Timeout performed Patient Re-evaluated:Patient Re-evaluated prior to induction Oxygen Delivery Method: Circle system utilized Preoxygenation: Pre-oxygenation with 100% oxygen Induction Type: IV induction Ventilation: Mask ventilation without difficulty Laryngoscope Size: Mac and 4 Grade View: Grade II Tube type: Oral Tube size: 7.5 mm Number of attempts: 1 Placement Confirmation: ETT inserted through vocal cords under direct vision, breath sounds checked- equal and bilateral and CO2 detector Secured at: 23 cm Tube secured with: Tape Dental Injury: Teeth and Oropharynx as per pre-operative assessment

## 2023-02-09 NOTE — Transfer of Care (Signed)
Immediate Anesthesia Transfer of Care Note  Patient: Douglas Carney  Procedure(s) Performed: LEFT REVERSE SHOULDER ARTHROPLASTY (Left: Shoulder)  Patient Location: PACU  Anesthesia Type:General and Regional  Level of Consciousness: drowsy  Airway & Oxygen Therapy: Patient Spontanous Breathing and Patient connected to face mask oxygen  Post-op Assessment: Report given to RN and Post -op Vital signs reviewed and stable  Post vital signs: Reviewed and stable  Last Vitals:  Vitals Value Taken Time  BP 181/151 02/09/23 0939  Temp    Pulse 72 02/09/23 0941  Resp 18 02/09/23 0941  SpO2 99 % 02/09/23 0941  Vitals shown include unfiled device data.  Last Pain:  Vitals:   02/09/23 0702  TempSrc: Oral  PainSc: 5       Patients Stated Pain Goal: 5 (02/09/23 7846)  Complications: No notable events documented.

## 2023-02-09 NOTE — Discharge Instructions (Addendum)
Discharge Instructions    Attending Surgeon: Huel Cote, MD Office Phone Number: [phone]   Diagnosis and Procedures:    Surgeries Performed: Left shoulder reverse shoulder arthroplasty  Discharge Plan:    Diet: Resume usual diet. Begin with light or bland foods.  Drink plenty of fluids.  Activity:  Keep sling and dressing in place until your follow up visit in Physical Therapy You are advised to go home directly from the hospital or surgical center. Restrict your activities.  GENERAL INSTRUCTIONS: 1.  Keep your surgical site elevated above your heart for at least 5-7 days or longer to prevent swelling. This will improve your comfort and your overall recovery following surgery.     2. Please call Dr. Serena Croissant office at [phone] with questions Monday-Friday during business hours. If no one answers, please leave a message and someone should get back to the patient within 24 hours. For emergencies please call 911 or proceed to the emergency room.   3. Patient to notify surgical team if experiences any of the following: Bowel/Bladder dysfunction, uncontrolled pain, nerve/muscle weakness, incision with increased drainage or redness, nausea/vomiting and Fever greater than 101.0 F.  Be alert for signs of infection including redness, streaking, odor, fever or chills. Be alert for excessive pain or bleeding and notify your surgeon immediately.  WOUND INSTRUCTIONS:   Leave your dressing/cast/splint in place until your post operative visit.  Keep it clean and dry.  Always keep the incision clean and dry until the staples/sutures are removed. If there is no drainage from the incision you should keep it open to air. If there is drainage from the incision you must keep it covered at all times until the drainage stops  Do not soak in a bath tub, hot tub, pool, lake or other body of water until 21 days after your surgery and your incision is completely dry and healed.  If you  have removable sutures (or staples) they must be removed 10-14 days (unless otherwise instructed) from the day of your surgery.     1)  Elevate the extremity as much as possible.  2)  Keep the dressing clean and dry.  3)  Please call us if the dressing becomes wet or dirty.  4)  If you are experiencing worsening pain or worsening swelling, please call.     MEDICATIONS: Resume all previous home medications at the previous prescribed dose and frequency unless otherwise noted Start taking the  pain medications on an as-needed basis as prescribed  Please taper down pain medication over the next week following surgery.  Ideally you should not require a refill of any narcotic pain medication.  Take pain medication with food to minimize nausea. In addition to the prescribed pain medication, you may take over-the-counter pain relievers such as Tylenol.  Do NOT take additional tylenol if your pain medication already has tylenol in it. Next dose of Tylenol if needed anytime after 12:45 today  Aspirin 325mg  daily per bottle instructions. Narcotic Policy: Per [facility] clinic policy, our goal is ensure optimal postoperative pain control with a multimodal pain management strategy. For all [facility] patients, our goal is to wean post-operative narcotic medications by 6 weeks post-operatively, and many times sooner. If this is not possible due to utilization of pain medication prior to surgery, your [facility] doctor will support your acute post-operative pain control for the first 6 weeks postoperatively, with a plan to transition you back to your primary pain team following that. Cyndia Skeeters will  work to ensure a Therapist, occupational.      FOLLOWUP INSTRUCTIONS: 1. Follow up at the Physical Therapy Clinic 3-4 days following surgery. This appointment should be scheduled unless other arrangements have been made.The Physical Therapy scheduling number is [phone] if an appointment has not already been arranged.  2.  Contact Dr. Serena Croissant office during office hours at [phone] or the practice after hours line at [phone] for non-emergencies. For medical emergencies call 911.   Discharge Location: Home   Post Anesthesia Home Care Instructions  Activity: Get plenty of rest for the remainder of the day. A responsible individual must stay with you for 24 hours following the procedure.  For the next 24 hours, DO NOT: -Drive a car -Advertising copywriter -Drink alcoholic beverages -Take any medication unless instructed by your physician -Make any legal decisions or sign important papers.  Meals: Start with liquid foods such as gelatin or soup. Progress to regular foods as tolerated. Avoid greasy, spicy, heavy foods. If nausea and/or vomiting occur, drink only clear liquids until the nausea and/or vomiting subsides. Call your physician if vomiting continues.  Special Instructions/Symptoms: Your throat may feel dry or sore from the anesthesia or the breathing tube placed in your throat during surgery. If this causes discomfort, gargle with warm salt water. The discomfort should disappear within 24 hours.  If you had a scopolamine patch placed behind your ear for the management of post- operative nausea and/or vomiting:  1. The medication in the patch is effective for 72 hours, after which it should be removed.  Wrap patch in a tissue and discard in the trash. Wash hands thoroughly with soap and water. 2. You may remove the patch earlier than 72 hours if you experience unpleasant side effects which may include dry mouth, dizziness or visual disturbances. 3. Avoid touching the patch. Wash your hands with soap and water after contact with the patch.    Post Anesthesia Home Care Instructions  Activity: Get plenty of rest for the remainder of the day. A responsible individual must stay with you for 24 hours following the procedure.  For the next 24 hours, DO NOT: -Drive a car -Advertising copywriter -Drink  alcoholic beverages -Take any medication unless instructed by your physician -Make any legal decisions or sign important papers.  Meals: Start with liquid foods such as gelatin or soup. Progress to regular foods as tolerated. Avoid greasy, spicy, heavy foods. If nausea and/or vomiting occur, drink only clear liquids until the nausea and/or vomiting subsides. Call your physician if vomiting continues.  Special Instructions/Symptoms: Your throat may feel dry or sore from the anesthesia or the breathing tube placed in your throat during surgery. If this causes discomfort, gargle with warm salt water. The discomfort should disappear within 24 hours.  If you had a scopolamine patch placed behind your ear for the management of post- operative nausea and/or vomiting:  1. The medication in the patch is effective for 72 hours, after which it should be removed.  Wrap patch in a tissue and discard in the trash. Wash hands thoroughly with soap and water. 2. You may remove the patch earlier than 72 hours if you experience unpleasant side effects which may include dry mouth, dizziness or visual disturbances. 3. Avoid touching the patch. Wash your hands with soap and water after contact with the patch.    Post Anesthesia Home Care Instructions  Activity: Get plenty of rest for the remainder of the day. A responsible individual must stay with you  for 24 hours following the procedure.  For the next 24 hours, DO NOT: -Drive a car -Advertising copywriter -Drink alcoholic beverages -Take any medication unless instructed by your physician -Make any legal decisions or sign important papers.  Meals: Start with liquid foods such as gelatin or soup. Progress to regular foods as tolerated. Avoid greasy, spicy, heavy foods. If nausea and/or vomiting occur, drink only clear liquids until the nausea and/or vomiting subsides. Call your physician if vomiting continues.  Special Instructions/Symptoms: Your throat may feel  dry or sore from the anesthesia or the breathing tube placed in your throat during surgery. If this causes discomfort, gargle with warm salt water. The discomfort should disappear within 24 hours.  If you had a scopolamine patch placed behind your ear for the management of post- operative nausea and/or vomiting:  1. The medication in the patch is effective for 72 hours, after which it should be removed.  Wrap patch in a tissue and discard in the trash. Wash hands thoroughly with soap and water. 2. You may remove the patch earlier than 72 hours if you experience unpleasant side effects which may include dry mouth, dizziness or visual disturbances. 3. Avoid touching the patch. Wash your hands with soap and water after contact with the patch.    Regional Anesthesia Blocks  1. You may not be able to move or feel the "blocked" extremity after a regional anesthetic block. This may last may last from 3-48 hours after placement, but it will go away. The length of time depends on the medication injected and your individual response to the medication. As the nerves start to wake up, you may experience tingling as the movement and feeling returns to your extremity. If the numbness and inability to move your extremity has not gone away after 48 hours, please call your surgeon.   2. The extremity that is blocked will need to be protected until the numbness is gone and the strength has returned. Because you cannot feel it, you will need to take extra care to avoid injury. Because it may be weak, you may have difficulty moving it or using it. You may not know what position it is in without looking at it while the block is in effect.  3. For blocks in the legs and feet, returning to weight bearing and walking needs to be done carefully. You will need to wait until the numbness is entirely gone and the strength has returned. You should be able to move your leg and foot normally before you try and bear weight or walk.  You will need someone to be with you when you first try to ensure you do not fall and possibly risk injury.  4. Bruising and tenderness at the needle site are common side effects and will resolve in a few days.  5. Persistent numbness or new problems with movement should be communicated to the surgeon or the [facility] [phone] [facility] [phone]).    Information for Discharge Teaching: EXPAREL (bupivacaine liposome injectable suspension)   Pain relief is important to your recovery. The goal is to control your pain so you can move easier and return to your normal activities as soon as possible after your procedure. Your physician may use several types of medicines to manage pain, swelling, and more.  Your surgeon or anesthesiologist gave you EXPAREL(bupivacaine) to help control your pain after surgery.  EXPAREL is a local anesthetic designed to release slowly over an extended period  of time to provide pain relief by numbing the tissue around the surgical site. EXPAREL is designed to release pain medication over time and can control pain for up to 72 hours. Depending on how you respond to EXPAREL, you may require less pain medication during your recovery. EXPAREL can help reduce or eliminate the need for opioids during the first few days after surgery when pain relief is needed the most. EXPAREL is not an opioid and is not addictive. It does not cause sleepiness or sedation.   Important! A teal colored band has been placed on your arm with the date, time and amount of EXPAREL you have received. Please leave this armband in place for the full 96 hours following administration, and then you may remove the band. If you return to the hospital for any reason within 96 hours following the administration of EXPAREL, the armband provides important information that your health care providers to know, and alerts them that you have received this anesthetic.     Possible side effects of EXPAREL: Temporary loss of sensation or ability to move in the area where medication was injected. Nausea, vomiting, constipation Rarely, numbness and tingling in your mouth or lips, lightheadedness, or anxiety may occur. Call your doctor right away if you think you may be experiencing any of these sensations, or if you have other questions regarding possible side effects.  Follow all other discharge instructions given to you by your surgeon or nurse. Eat a healthy diet and drink plenty of water or other fluids.

## 2023-02-09 NOTE — Op Note (Signed)
Date of Surgery: 02/09/2023  INDICATIONS: Mr. Calderone is a 72 y.o.-year-old male with left shoulder end stage osteoarthritis.  The risk and benefits of the procedure were discussed in detail and documented in the pre-operative evaluation.   PREOPERATIVE DIAGNOSIS: 1. Left shoulder osteoarthritis  POSTOPERATIVE DIAGNOSIS: Same.  PROCEDURE: 1. Left shoulder reverse shoulder arthroplasty 2. Left shoulder biceps tenodesis  SURGEON: Benancio Deeds MD  ASSISTANT: Kerby Less, ATC  ANESTHESIA:  general plus interscalene nerve block  IV FLUIDS AND URINE: See anesthesia record.  ANTIBIOTICS: Ancef  ESTIMATED BLOOD LOSS: 50 mL.  IMPLANTS:  Implant Name Type Inv. Item Serial No. Manufacturer Lot No. LRB No. Used Action  SCREW BONE INTRNL SM 7 - [id number] Screw SCREW BONE INTRNL SM 7 [id number] [employer]  Left 1 Implanted  BASEPLATE SHOULDER FW 15D 29 - [id number] Joint BASEPLATE SHOULDER FW 15D 29 [id number] [employer]  Left 1 Implanted  GLENOSPHERE STANDARD 39 - [id number] Joint GLENOSPHERE STANDARD 39 [id number] [employer]  Left 1 Implanted  STEM HUM PERFORM 3+ - [id number] Stem STEM HUM PERFORM 3+ [id number] [employer]  Left 1 Implanted  CUP HUM INSERT SHLD 3/4 39 +0 - [id number] Cup CUP HUM INSERT SHLD 3/4 39 +0 [id number] [employer]  Left 1 Implanted  SCREW 5.5X22 - [id number] Screw SCREW 5.5X22  [employer] ON STERILE SET Left 1 Implanted  SCREW PERIPHERAL 30 - [id number] Screw SCREW PERIPHERAL 30  [employer] ON STERILE SET Left 2 Implanted  SCREW PERIPHERAL 5.0X34 - [id number] Screw SCREW PERIPHERAL 5.0X34  [employer] ON STERILE SET Left 1 Implanted    DRAINS: None  CULTURES: None  COMPLICATIONS: none  DESCRIPTION OF PROCEDURE:   Patient was identified in the preoperative holding area.  Anesthesia performed an interscalene nerve block after universal timeout was performed with nursing.  Ancef was given 1 hour prior to skin incision.    The surgical site was  scrubbed with a chlorhexidine scrub brush and alcohol.  The patient was then prepped with chlorhexidine skin prep.  The patient was subsequently taken back to the operating room.  Anesthesia was induced. The patient was transferred to the beachchair position.  All bony prominences were padded.  Final timeout was again performed.     The bony landmarks of the shoulder were marked with a marking pen. A delto-pectoral incision was made, extending up approximately 5 inches. The wound with then irrigated with dilute betadine. Cephalic vein was identified, and an protected. This was retracted medially. Subdeltoid and subpectoral lesions were released. Neurovascular structures were carefully protected. The Gelpi retractor was used to retract the deltoid and pectoralis major.    The deltoid was retracted laterally with a Brown humeral retractor.  The conjoined tendon was identified. The cleido-pectoral fascia was excised.  The axillary nerve was palpated and carefully protected throughout the procedure. The biceps tendon was found and tenodesed to the upper pec with # 2 Ethibond non-absorbable suture.  Proximally the biceps tendon was removed up to the joint.  The bicipital groove was used for a landmark to establish rotator cuff interval. The subscap was tagged with a #2 Ethibond.  At this point the subscapularis was peeled off from the lesser tuberosity with care to avoid dissection distally in order to protect the axillary nerve.  Once the joint was exposed the proximal humerus was delivered with external rotation and extension of the arm. The humerus was prepped initially by performing a humeral neck cut. This was done with the guide  using 30 degrees of retroversion as a reference.  The head portion was removed.  A medullary sounding reamer was then used.  We subsequently placed our guidewire through the center of the humeral head using the reference guide.  This was a size 3.  Metaphyseal reamer was then used.   Finally the size 3 broach was malleted into place with excellent purchase.  A tonsil clamp was used to attempt to pull this out with very good purchase   Attention was then turned to the glenoid.  Posteriorly a large Darach retractor was used.  A 360 Degree release of the subscapularis and glenoid were done. The capsule was released from the humerus.    Glenoid retractors were placed posteriorly, superiorly behind the biceps tendon and anteriorly on the glenoid neck. A 360-degree release of the capsule was performed with cautery.  The triceps was released off the inferior tubercle of the glenoid. The axillary nerve was carefully protected with the surgeon's index finger, retracting it and using cautery.   A guidepin was placed through the glenoid guide. The guidepin was drilled until it exited the cortex. The guidepin was over drilled. Next, the glenoid was prepared with the reamer down to cortical bone.  The central peg hole was totally within the scapular neck tested with the probe.  The baseplate was then placed screwed securely with good purchase in position and then secured with 4 screws. In each case, they were drilled and measured and the appropriate length screw placed with excellent rigid fixation of the baseplate. The glenosphere was placed with size based based on pre-operative templating.   The humerus was then delivered and a neutral polyethylene trial was placed.  This was brought to just the level of the reduction but not completely reduced.  A 0 retentive final poly was selected and impacted.    Appropriate tension was noted on the conjoined tendon and deltoid muscle.  Extension was stable, external and internal rotation as well.  The subscap was pulled over but as this was not able to reach comfortably decision was made not to repair in order to prevent limited in external rotation.  The wound was then irrigated. Vancomycin powder was placed in the wound again for infection prevention.    The wound was then closed in layers with 0 Vicryl interrupted in the deep subcu followed by 2-0 Vicryl in the superficial subcu and 3-0 nylon for skin.  An Aquacel dressing was applied as well as an Veterinary surgeon.  A shoulder immobilizer was applied.     Postoperative Plan: -The patient will begin the reverse shoulder rehab protocol  -Aspirin 325 mg daily will be used for 4 weeks for blood clot prevention -I will see the patient back in 2 weeks for first postoperative wound check      Benancio Deeds, MD 9:08 AM

## 2023-02-10 ENCOUNTER — Other Ambulatory Visit: Payer: Self-pay | Admitting: *Deleted

## 2023-02-10 ENCOUNTER — Encounter (HOSPITAL_BASED_OUTPATIENT_CLINIC_OR_DEPARTMENT_OTHER): Payer: Self-pay | Admitting: Orthopaedic Surgery

## 2023-02-10 ENCOUNTER — Telehealth: Payer: Self-pay | Admitting: *Deleted

## 2023-02-10 MED ORDER — DIPHENHYDRAMINE HCL 50 MG PO TABS: 50.0000 mg | ORAL_TABLET | Freq: Once | ORAL | 0 refills | Status: DC

## 2023-02-10 MED ORDER — PREDNISONE 50 MG PO TABS
ORAL_TABLET | ORAL | 0 refills | Status: DC
Start: 2023-02-10 — End: 2023-04-01

## 2023-02-10 NOTE — Progress Notes (Signed)
Contacted patient. Reviewed contrast dye allergy prophylaxis instructions with patient. Medication sent to preferred pharmacy.

## 2023-02-10 NOTE — Telephone Encounter (Signed)
Spoke with patient today and he is doing well. States block has completely worn off but doing ok with this. Mentioned that he thinks he does have a "chipped tooth from general anesthesia". I just told him I'd pass this info on to you. He wasn't overly concerned with it and can feel it with his tongue, but hasn't looked in the mirror to see if it is noticeable. I explained that is a risk with anesthesia. Otherwise doing great.

## 2023-02-12 ENCOUNTER — Telehealth: Payer: Self-pay | Admitting: Radiology

## 2023-02-12 ENCOUNTER — Other Ambulatory Visit: Payer: Self-pay | Admitting: Surgery

## 2023-02-12 DIAGNOSIS — I712 Thoracic aortic aneurysm, without rupture, unspecified: Secondary | ICD-10-CM

## 2023-02-12 NOTE — Telephone Encounter (Signed)
Patient called triage line asking if he can shower. He is status post reverse shoulder arthroplasty on 02/09/2023 and describes water proof bandage as dressing. Per Maurine Cane with Dr. Serena Croissant office, ok to shower. I called patient back and advised.

## 2023-02-15 ENCOUNTER — Encounter (HOSPITAL_BASED_OUTPATIENT_CLINIC_OR_DEPARTMENT_OTHER): Payer: Self-pay | Admitting: Orthopaedic Surgery

## 2023-02-15 DIAGNOSIS — M25612 Stiffness of left shoulder, not elsewhere classified: Secondary | ICD-10-CM | POA: Diagnosis not present

## 2023-02-15 DIAGNOSIS — Z471 Aftercare following joint replacement surgery: Secondary | ICD-10-CM | POA: Diagnosis not present

## 2023-02-15 DIAGNOSIS — M25512 Pain in left shoulder: Secondary | ICD-10-CM | POA: Diagnosis not present

## 2023-02-17 DIAGNOSIS — Z471 Aftercare following joint replacement surgery: Secondary | ICD-10-CM | POA: Diagnosis not present

## 2023-02-17 DIAGNOSIS — M25612 Stiffness of left shoulder, not elsewhere classified: Secondary | ICD-10-CM | POA: Diagnosis not present

## 2023-02-17 DIAGNOSIS — M25512 Pain in left shoulder: Secondary | ICD-10-CM | POA: Diagnosis not present

## 2023-02-18 DIAGNOSIS — Z471 Aftercare following joint replacement surgery: Secondary | ICD-10-CM | POA: Diagnosis not present

## 2023-02-18 DIAGNOSIS — M25512 Pain in left shoulder: Secondary | ICD-10-CM | POA: Diagnosis not present

## 2023-02-18 DIAGNOSIS — M25612 Stiffness of left shoulder, not elsewhere classified: Secondary | ICD-10-CM | POA: Diagnosis not present

## 2023-02-19 ENCOUNTER — Ambulatory Visit (HOSPITAL_BASED_OUTPATIENT_CLINIC_OR_DEPARTMENT_OTHER): Payer: Medicare HMO | Admitting: Orthopaedic Surgery

## 2023-02-19 ENCOUNTER — Ambulatory Visit (HOSPITAL_BASED_OUTPATIENT_CLINIC_OR_DEPARTMENT_OTHER): Payer: Medicare HMO

## 2023-02-19 DIAGNOSIS — Z471 Aftercare following joint replacement surgery: Secondary | ICD-10-CM | POA: Diagnosis not present

## 2023-02-19 DIAGNOSIS — M19012 Primary osteoarthritis, left shoulder: Secondary | ICD-10-CM | POA: Diagnosis not present

## 2023-02-19 DIAGNOSIS — M25612 Stiffness of left shoulder, not elsewhere classified: Secondary | ICD-10-CM | POA: Diagnosis not present

## 2023-02-19 DIAGNOSIS — M25512 Pain in left shoulder: Secondary | ICD-10-CM | POA: Diagnosis not present

## 2023-02-19 DIAGNOSIS — Z96612 Presence of left artificial shoulder joint: Secondary | ICD-10-CM | POA: Diagnosis not present

## 2023-02-19 NOTE — Progress Notes (Signed)
Post Operative Evaluation    Procedure/Date of Surgery: Left reverse shoulder arthroplasty 12/10  Interval History:    Presents 2 weeks status post the above procedure.  Overall he is doing remarkably well.  He has little to no pain just some occasional soreness.  He has been working with physical therapy which is going well.   PMH/PSH/Family History/Social History/Meds/Allergies:    Past Medical History:  Diagnosis Date   Arthritis    right hip   BPH (benign prostatic hyperplasia)    Cancer (HCC) 2005   melanoma removed right shoulder   COVID 1-18-2-22   fever chills body aches and cough x 5 days all symptoms resolved   Family history of adverse reaction to anesthesia    mother had brain fog after anesthesia lasted 4 to 5 dsys   Hypertension    Sleep apnea    Syncope    Wears glasses    Past Surgical History:  Procedure Laterality Date   APPENDECTOMY     CHOLECYSTECTOMY     REVERSE SHOULDER ARTHROPLASTY Left 02/09/2023   Procedure: LEFT REVERSE SHOULDER ARTHROPLASTY;  Surgeon: Huel Cote, MD;  Location: [facility];  Service: Orthopedics;  Laterality: Left;   right shoulder fracture surgery  07/14/2003   right shoulder pins removed  2027   wires and screws also removed   TONSILLECTOMY  age 59   and adenoids removed   TRANSURETHRAL RESECTION OF PROSTATE N/A 04/12/2020   Procedure: TRANSURETHRAL RESECTION OF THE PROSTATE (TURP);  Surgeon: Bjorn Pippin, MD;  Location: [facility];  Service: Urology;  Laterality: N/A;   Social History   Socioeconomic History   Marital status: Widowed    Spouse name: Not on file   Number of children: Not on file   Years of education: Not on file   Highest education level: Not on file  Occupational History   Occupation: Textiles  Tobacco Use   Smoking status: Never   Smokeless tobacco: Never  Vaping Use   Vaping status: Never Used  Substance and Sexual Activity    Alcohol use: Yes    Comment: rare   Drug use: Not Currently   Sexual activity: Not Currently  Other Topics Concern   Not on file  Social History Narrative   Not on file   Social Drivers of Health   Financial Resource Strain: Low Risk  (07/20/2022)   Received from [facility], [facility]   Overall Financial Resource Strain (CARDIA)    Difficulty of Paying Living Expenses: Not hard at all  Food Insecurity: No Food Insecurity (11/01/2022)   Hunger Vital Sign    Worried About Running Out of Food in the Last Year: Never true    Ran Out of Food in the Last Year: Never true  Transportation Needs: No Transportation Needs (11/01/2022)   PRAPARE - Administrator, Civil Service (Medical): No    Lack of Transportation (Non-Medical): No  Physical Activity: Not on file  Stress: Not on file  Social Connections: Not on file   Family History  Problem Relation Age of Onset   Lymphoma Mother    Cancer Father 62       Melanoma   Allergies  Allergen Reactions   Contrast Media [Iodinated Contrast Media] Anaphylaxis  Meloxicam Diarrhea    Other Reaction(s): Other (See Comments)  GI upset   Current Outpatient Medications  Medication Sig Dispense Refill   acetaminophen (TYLENOL) 500 MG tablet Take 1 tablet (500 mg total) by mouth every 8 (eight) hours for 10 days. 30 tablet 0   amLODipine (NORVASC) 5 MG tablet Take 5 mg by mouth daily.     aspirin EC 325 MG tablet Take 1 tablet (325 mg total) by mouth daily. 14 tablet 0   diphenhydrAMINE (BENADRYL) 50 MG tablet Take 1 tablet (50 mg total) by mouth once for 1 dose. Please take 50mg  of Benadryl 1 hour prior to CT scan 1 tablet 0   losartan-hydrochlorothiazide (HYZAAR) 100-25 MG tablet Take 1 tablet by mouth daily.     oxyCODONE (ROXICODONE) 5 MG immediate release tablet Take 1 tablet (5 mg total) by mouth every 4 (four) hours as needed for severe pain (pain score 7-10) or breakthrough pain. 30  tablet 0   predniSONE (DELTASONE) 50 MG tablet Please take 1 tablet 13 hours prior to CT scan, 1 table 7 hours prior to CT scan, and then 1 tablet 1 hour prior to scheduled CT scan. 3 tablet 0   No current facility-administered medications for this visit.   No results found.  Review of Systems:   A ROS was performed including pertinent positives and negatives as documented in the HPI.   Musculoskeletal Exam:    There were no vitals taken for this visit.  Left shoulder incision is well-appearing without erythema or drainage.  Fires all 3 else of the deltoid.  In the supine position he can forward elevate to 90 degrees with external rotation at side to 30 degrees.  2+ radial pulse.  Distal neurosensory exam is intact  Imaging:    3 views left shoulder: Status post reverse shoulder arthroplasty without evidence of complication  I personally reviewed and interpreted the radiographs.   Assessment:   2 weeks status post left reverse shoulder arthroplasty without evidence of complication overall doing extremely well.  At this time he will continue to advance to active range of motion.  He will use a sling at night for 2 additional weeks.  Plan :    -Return to clinic 4 weeks for reassessment      I personally saw and evaluated the patient, and participated in the management and treatment plan.  Huel Cote, MD Attending Physician, Orthopedic Surgery  This document was dictated using Dragon voice recognition software. A reasonable attempt at proof reading has been made to minimize errors.

## 2023-02-22 ENCOUNTER — Ambulatory Visit (HOSPITAL_COMMUNITY): Payer: Medicare HMO

## 2023-03-01 DIAGNOSIS — M25612 Stiffness of left shoulder, not elsewhere classified: Secondary | ICD-10-CM | POA: Diagnosis not present

## 2023-03-01 DIAGNOSIS — M25512 Pain in left shoulder: Secondary | ICD-10-CM | POA: Diagnosis not present

## 2023-03-01 DIAGNOSIS — Z471 Aftercare following joint replacement surgery: Secondary | ICD-10-CM | POA: Diagnosis not present

## 2023-03-04 ENCOUNTER — Ambulatory Visit: Payer: Medicare HMO | Admitting: Internal Medicine

## 2023-03-04 DIAGNOSIS — Z471 Aftercare following joint replacement surgery: Secondary | ICD-10-CM | POA: Diagnosis not present

## 2023-03-04 DIAGNOSIS — M25512 Pain in left shoulder: Secondary | ICD-10-CM | POA: Diagnosis not present

## 2023-03-04 DIAGNOSIS — M25612 Stiffness of left shoulder, not elsewhere classified: Secondary | ICD-10-CM | POA: Diagnosis not present

## 2023-03-05 DIAGNOSIS — M25612 Stiffness of left shoulder, not elsewhere classified: Secondary | ICD-10-CM | POA: Diagnosis not present

## 2023-03-05 DIAGNOSIS — M25512 Pain in left shoulder: Secondary | ICD-10-CM | POA: Diagnosis not present

## 2023-03-05 DIAGNOSIS — Z471 Aftercare following joint replacement surgery: Secondary | ICD-10-CM | POA: Diagnosis not present

## 2023-03-08 ENCOUNTER — Ambulatory Visit (HOSPITAL_COMMUNITY): Admission: RE | Admit: 2023-03-08 | Payer: Medicare HMO | Source: Ambulatory Visit

## 2023-03-08 ENCOUNTER — Ambulatory Visit: Payer: Medicare HMO

## 2023-03-15 ENCOUNTER — Ambulatory Visit: Payer: Medicare HMO

## 2023-03-16 DIAGNOSIS — Z471 Aftercare following joint replacement surgery: Secondary | ICD-10-CM | POA: Diagnosis not present

## 2023-03-16 DIAGNOSIS — M25512 Pain in left shoulder: Secondary | ICD-10-CM | POA: Diagnosis not present

## 2023-03-16 DIAGNOSIS — M25612 Stiffness of left shoulder, not elsewhere classified: Secondary | ICD-10-CM | POA: Diagnosis not present

## 2023-03-18 NOTE — Progress Notes (Unsigned)
[address]       [place] [address]             [phone]        Douglas Carney [id number] February 26, 1951  History of Present Illness: Douglas Carney is a 73 year old male with a past medical history BPH, HTN, sleep apnea, and arthritis.  He was referred to [place] in 2022 after having a calcium scoring CT scan by his primary care physician that incidentally found a 4.3 cm ascending aortic aneurysm, in 2023 the ascending thoracic aorta was read at 4.0cm and he was instructed to follow up in 18 months. In August 2024 he developed chest wall pain and noncontrast chest CT showed likely pneumonia but there was no mention of an aneurysmal ascending aorta.  He denies family history of TAA and personal history of connective tissue disorder.   Today he reports ***    Current Outpatient Medications on File Prior to Visit  Medication Sig Dispense Refill   amLODipine (NORVASC) 5 MG tablet Take 5 mg by mouth daily.     aspirin EC 325 MG tablet Take 1 tablet (325 mg total) by mouth daily. 14 tablet 0   diphenhydrAMINE (BENADRYL) 50 MG tablet Take 1 tablet (50 mg total) by mouth once for 1 dose. Please take 50mg  of Benadryl 1 hour prior to CT scan 1 tablet 0   losartan-hydrochlorothiazide (HYZAAR) 100-25 MG tablet Take 1 tablet by mouth daily.     oxyCODONE (ROXICODONE) 5 MG immediate release tablet Take 1 tablet (5 mg total) by mouth every 4 (four) hours as needed for severe pain (pain score 7-10) or breakthrough pain. 30 tablet 0   predniSONE (DELTASONE) 50 MG tablet Please take 1 tablet 13 hours prior to CT scan, 1 table 7 hours prior to CT scan, and then 1 tablet 1 hour prior to scheduled CT scan. 3 tablet 0   No current facility-administered medications on file prior to visit.   Vitals:  Physical Exam General: Neuro: CV: Pulm: GI: Extremities:  CTA Results: CLINICAL DATA:  Pain.  Aneurysm   EXAM: CT ANGIOGRAPHY CHEST WITH CONTRAST   TECHNIQUE: Multidetector CT  imaging of the chest was performed using the standard protocol during bolus administration of intravenous contrast. Multiplanar CT image reconstructions and MIPs were obtained to evaluate the vascular anatomy.   RADIATION DOSE REDUCTION: This exam was performed according to the departmental dose-optimization program which includes automated exposure control, adjustment of the mA and/or kV according to patient size and/or use of iterative reconstruction technique.   CONTRAST:  OMNIPAQUE IOHEXOL 350 MG/ML SOLN   COMPARISON:  Noncontrast CT 10/31/2022.   FINDINGS: Cardiovascular: Heart is nonenlarged. Trace pericardial fluid. The thoracic aorta has some slight wall thickening and some mild calcified plaque. Slightly tortuous course to the origin of the great vessels. Pulsation artifact along the ascending aorta limiting evaluation. Diameter of the aortic root is measured at 3.3 cm. Distal aortic arch has a diameter of 2.9 cm. Diameter of the ascending aorta the level of the right pulmonary artery of 3.8 cm. The descending thoracic aorta at the same level 3.0 cm. No dissection. No clear aneurysmal dilatation. On the prior study ascending aorta measured up to 3.9 cm. Descending thoracic aorta 2.9 cm when measured in the same fashion as today.   Mediastinum/Nodes: Slightly patulous thoracic esophagus. Small thyroid gland. No specific abnormal lymph node enlargement present in the axillary regions, hilum or mediastinum.  Lungs/Pleura: Breathing motion identified diffusely. On the prior there is some consolidative opacity seen along the right lung base particularly the right lower lobe which is markedly improved. Some minimal linear changes at the right lung base. No right-sided effusion, pneumothorax. However there is a new small to moderate left pleural effusion. The adjacent opacity at the left lung base is increased from previous. Is also some bandlike areas in the  lingula. No pneumothorax.   Upper Abdomen: The adrenal glands are preserved in the upper abdomen. Fatty liver infiltration.   Musculoskeletal: Diffuse degenerative changes along the spine with bridging syndesmophytes and osteophytes. Multilevel Schmorl's node changes as well   Review of the MIP images confirms the above findings.   IMPRESSION: Significant pulsation artifact with mild atherosclerotic calcified plaque along the thoracic aorta. Maximal diameter of the ascending aorta approaches 3.8 cm today, similar to previous. No dissection or true aneurysm.   Development of a small to moderate left pleural effusion with adjacent opacity of uncertain etiology. Please correlate with clinical history and further workup or follow-up is recommended.   Fatty liver infiltration.   Motion artifact   Aortic Atherosclerosis (ICD10-I70.0).     Electronically Signed   By: Karen Kays M.D.   On: 03/25/2023 18:49    Impression and Plan: [name] presented to the clinic with a 3.8cm ascending aortic aneurysm.  Echocardiogram on 03/25/21 shows a tricuspid aortic valve without evidence of regurgitation.  We discussed the natural history and and risk factors for growth of ascending aortic aneurysms.  We covered the importance of smoking cessation, tight blood pressure control, refraining from lifting heavy objects, and avoiding fluoroquinolones.  The patient is aware of signs and symptoms of aortic dissection and when to present to the emergency department.  We will continue surveillance and a repeat ** was ordered for ***.    Risk Modification:  Statin:  ***  Smoking cessation instruction/counseling given:  {CHL AMB [facility] SMOKING CESSATION COUNSELING:20758}  Patient was counseled on importance of Blood Pressure Control.  Despite Medical intervention if the patient notices persistently elevated blood pressure readings.  They are instructed to contact their Primary Care  Physician  Please avoid use of Fluoroquinolones as this can potentially increase your risk of Aortic Rupture and/or Dissection  Patient educated on signs and symptoms of Aortic Dissection, handout also provided in AVS  [name], PA-C 03/18/23

## 2023-03-19 ENCOUNTER — Ambulatory Visit (HOSPITAL_BASED_OUTPATIENT_CLINIC_OR_DEPARTMENT_OTHER): Payer: Medicare HMO | Admitting: Orthopaedic Surgery

## 2023-03-19 DIAGNOSIS — M19012 Primary osteoarthritis, left shoulder: Secondary | ICD-10-CM

## 2023-03-19 NOTE — Progress Notes (Signed)
Post Operative Evaluation    Procedure/Date of Surgery: Left reverse shoulder arthroplasty 12/10  Interval History:    Presents 6 weeks status post the above procedure.  Overall he is doing remarkably well.  Continues to work on active range of motion.  Has minimal to no pain with physical therapy.   PMH/PSH/Family History/Social History/Meds/Allergies:    Past Medical History:  Diagnosis Date   Arthritis    right hip   BPH (benign prostatic hyperplasia)    Cancer (HCC) 2005   melanoma removed right shoulder   COVID 1-18-2-22   fever chills body aches and cough x 5 days all symptoms resolved   Family history of adverse reaction to anesthesia    mother had brain fog after anesthesia lasted 4 to 5 dsys   Hypertension    Sleep apnea    Syncope    Wears glasses    Past Surgical History:  Procedure Laterality Date   APPENDECTOMY     CHOLECYSTECTOMY     REVERSE SHOULDER ARTHROPLASTY Left 02/09/2023   Procedure: LEFT REVERSE SHOULDER ARTHROPLASTY;  Surgeon: Huel Cote, MD;  Location: [facility];  Service: Orthopedics;  Laterality: Left;   right shoulder fracture surgery  07/14/2003   right shoulder pins removed  2027   wires and screws also removed   TONSILLECTOMY  age 46   and adenoids removed   TRANSURETHRAL RESECTION OF PROSTATE N/A 04/12/2020   Procedure: TRANSURETHRAL RESECTION OF THE PROSTATE (TURP);  Surgeon: Bjorn Pippin, MD;  Location: [facility];  Service: Urology;  Laterality: N/A;   Social History   Socioeconomic History   Marital status: Widowed    Spouse name: Not on file   Number of children: Not on file   Years of education: Not on file   Highest education level: Not on file  Occupational History   Occupation: Textiles  Tobacco Use   Smoking status: Never   Smokeless tobacco: Never  Vaping Use   Vaping status: Never Used  Substance and Sexual Activity   Alcohol use: Yes     Comment: rare   Drug use: Not Currently   Sexual activity: Not Currently  Other Topics Concern   Not on file  Social History Narrative   Not on file   Social Drivers of Health   Financial Resource Strain: Low Risk  (07/20/2022)   Received from [facility], [facility]   Overall Financial Resource Strain (CARDIA)    Difficulty of Paying Living Expenses: Not hard at all  Food Insecurity: No Food Insecurity (11/01/2022)   Hunger Vital Sign    Worried About Running Out of Food in the Last Year: Never true    Ran Out of Food in the Last Year: Never true  Transportation Needs: No Transportation Needs (11/01/2022)   PRAPARE - Administrator, Civil Service (Medical): No    Lack of Transportation (Non-Medical): No  Physical Activity: Not on file  Stress: Not on file  Social Connections: Not on file   Family History  Problem Relation Age of Onset   Lymphoma Mother    Cancer Father 69       Melanoma   Allergies  Allergen Reactions   Contrast Media [Iodinated Contrast Media] Anaphylaxis   Meloxicam Diarrhea  Other Reaction(s): Other (See Comments)  GI upset   Current Outpatient Medications  Medication Sig Dispense Refill   amLODipine (NORVASC) 5 MG tablet Take 5 mg by mouth daily.     aspirin EC 325 MG tablet Take 1 tablet (325 mg total) by mouth daily. 14 tablet 0   diphenhydrAMINE (BENADRYL) 50 MG tablet Take 1 tablet (50 mg total) by mouth once for 1 dose. Please take 50mg  of Benadryl 1 hour prior to CT scan 1 tablet 0   losartan-hydrochlorothiazide (HYZAAR) 100-25 MG tablet Take 1 tablet by mouth daily.     oxyCODONE (ROXICODONE) 5 MG immediate release tablet Take 1 tablet (5 mg total) by mouth every 4 (four) hours as needed for severe pain (pain score 7-10) or breakthrough pain. 30 tablet 0   predniSONE (DELTASONE) 50 MG tablet Please take 1 tablet 13 hours prior to CT scan, 1 table 7 hours prior to CT scan, and then 1 tablet 1 hour  prior to scheduled CT scan. 3 tablet 0   No current facility-administered medications for this visit.   No results found.  Review of Systems:   A ROS was performed including pertinent positives and negatives as documented in the HPI.   Musculoskeletal Exam:    There were no vitals taken for this visit.  Left shoulder incision is well-appearing without erythema or drainage.  Fires all 3 else of the deltoid.  In the supine position he can forward elevate to 120 degrees with external rotation at side to 30 degrees.  2+ radial pulse.  Distal neurosensory exam is intact  Imaging:    3 views left shoulder: Status post reverse shoulder arthroplasty without evidence of complication  I personally reviewed and interpreted the radiographs.   Assessment:   6 weeks status post left reverse shoulder arthroplasty without evidence of complication overall doing extremely well.  This time we will continue to work on active range of motion and strengthening.  I will plan to see him back in 6-week for reassessment Plan :    -Return to clinic 6 weeks for reassessment      I personally saw and evaluated the patient, and participated in the management and treatment plan.  Huel Cote, MD Attending Physician, Orthopedic Surgery  This document was dictated using Dragon voice recognition software. A reasonable attempt at proof reading has been made to minimize errors.

## 2023-03-22 DIAGNOSIS — M25612 Stiffness of left shoulder, not elsewhere classified: Secondary | ICD-10-CM | POA: Diagnosis not present

## 2023-03-22 DIAGNOSIS — Z471 Aftercare following joint replacement surgery: Secondary | ICD-10-CM | POA: Diagnosis not present

## 2023-03-22 DIAGNOSIS — M25512 Pain in left shoulder: Secondary | ICD-10-CM | POA: Diagnosis not present

## 2023-03-23 ENCOUNTER — Telehealth: Payer: Self-pay | Admitting: *Deleted

## 2023-03-23 NOTE — Telephone Encounter (Signed)
Patient contacted and contrast dye allergy prep re-discussed. Patient prepared to start prep tomorrow for CTA scheduled for 1/23.

## 2023-03-24 DIAGNOSIS — M25512 Pain in left shoulder: Secondary | ICD-10-CM | POA: Diagnosis not present

## 2023-03-24 DIAGNOSIS — Z471 Aftercare following joint replacement surgery: Secondary | ICD-10-CM | POA: Diagnosis not present

## 2023-03-24 DIAGNOSIS — M25612 Stiffness of left shoulder, not elsewhere classified: Secondary | ICD-10-CM | POA: Diagnosis not present

## 2023-03-25 ENCOUNTER — Ambulatory Visit (HOSPITAL_COMMUNITY)
Admission: RE | Admit: 2023-03-25 | Discharge: 2023-03-25 | Disposition: A | Discharge status: Home / Self Care | Payer: Medicare HMO | Source: Ambulatory Visit | Attending: Surgery | Admitting: Surgery

## 2023-03-25 DIAGNOSIS — J90 Pleural effusion, not elsewhere classified: Secondary | ICD-10-CM | POA: Diagnosis not present

## 2023-03-25 DIAGNOSIS — I700 Atherosclerosis of aorta: Secondary | ICD-10-CM | POA: Diagnosis not present

## 2023-03-25 DIAGNOSIS — R079 Chest pain, unspecified: Secondary | ICD-10-CM | POA: Diagnosis not present

## 2023-03-25 DIAGNOSIS — K760 Fatty (change of) liver, not elsewhere classified: Secondary | ICD-10-CM | POA: Diagnosis not present

## 2023-03-25 DIAGNOSIS — I712 Thoracic aortic aneurysm, without rupture, unspecified: Secondary | ICD-10-CM | POA: Insufficient documentation

## 2023-03-25 LAB — POCT I-STAT CREATININE: Creatinine, Ser: 1.2 mg/dL (ref 0.61–1.24)

## 2023-03-25 MED ORDER — IOHEXOL 350 MG/ML SOLN
100.0000 mL | Freq: Once | INTRAVENOUS | Status: AC | PRN
  Administered 2023-03-25: 100 mL via INTRAVENOUS

## 2023-03-26 DIAGNOSIS — Z471 Aftercare following joint replacement surgery: Secondary | ICD-10-CM | POA: Diagnosis not present

## 2023-03-26 DIAGNOSIS — M25612 Stiffness of left shoulder, not elsewhere classified: Secondary | ICD-10-CM | POA: Diagnosis not present

## 2023-03-26 DIAGNOSIS — M25512 Pain in left shoulder: Secondary | ICD-10-CM | POA: Diagnosis not present

## 2023-03-29 DIAGNOSIS — M25512 Pain in left shoulder: Secondary | ICD-10-CM | POA: Diagnosis not present

## 2023-03-29 DIAGNOSIS — Z471 Aftercare following joint replacement surgery: Secondary | ICD-10-CM | POA: Diagnosis not present

## 2023-03-29 DIAGNOSIS — M25612 Stiffness of left shoulder, not elsewhere classified: Secondary | ICD-10-CM | POA: Diagnosis not present

## 2023-03-30 DIAGNOSIS — M1611 Unilateral primary osteoarthritis, right hip: Secondary | ICD-10-CM | POA: Diagnosis not present

## 2023-03-30 DIAGNOSIS — Z96641 Presence of right artificial hip joint: Secondary | ICD-10-CM | POA: Diagnosis not present

## 2023-03-31 DIAGNOSIS — M25612 Stiffness of left shoulder, not elsewhere classified: Secondary | ICD-10-CM | POA: Diagnosis not present

## 2023-03-31 DIAGNOSIS — M25512 Pain in left shoulder: Secondary | ICD-10-CM | POA: Diagnosis not present

## 2023-03-31 DIAGNOSIS — Z471 Aftercare following joint replacement surgery: Secondary | ICD-10-CM | POA: Diagnosis not present

## 2023-04-01 ENCOUNTER — Encounter: Payer: Self-pay | Admitting: Physician Assistant

## 2023-04-01 ENCOUNTER — Ambulatory Visit: Payer: Medicare HMO | Admitting: Physician Assistant

## 2023-04-01 VITALS — BP 123/81 | HR 88 | Resp 20 | Wt 296.2 lb

## 2023-04-01 DIAGNOSIS — I7121 Aneurysm of the ascending aorta, without rupture: Secondary | ICD-10-CM | POA: Diagnosis not present

## 2023-04-01 NOTE — Patient Instructions (Signed)

## 2023-04-05 DIAGNOSIS — Z96612 Presence of left artificial shoulder joint: Secondary | ICD-10-CM | POA: Diagnosis not present

## 2023-04-05 DIAGNOSIS — M25512 Pain in left shoulder: Secondary | ICD-10-CM | POA: Diagnosis not present

## 2023-04-07 DIAGNOSIS — M25512 Pain in left shoulder: Secondary | ICD-10-CM | POA: Diagnosis not present

## 2023-04-07 DIAGNOSIS — Z96612 Presence of left artificial shoulder joint: Secondary | ICD-10-CM | POA: Diagnosis not present

## 2023-04-12 DIAGNOSIS — M25512 Pain in left shoulder: Secondary | ICD-10-CM | POA: Diagnosis not present

## 2023-04-12 DIAGNOSIS — Z96612 Presence of left artificial shoulder joint: Secondary | ICD-10-CM | POA: Diagnosis not present

## 2023-04-14 DIAGNOSIS — Z96612 Presence of left artificial shoulder joint: Secondary | ICD-10-CM | POA: Diagnosis not present

## 2023-04-14 DIAGNOSIS — M25512 Pain in left shoulder: Secondary | ICD-10-CM | POA: Diagnosis not present

## 2023-04-16 DIAGNOSIS — Z96612 Presence of left artificial shoulder joint: Secondary | ICD-10-CM | POA: Diagnosis not present

## 2023-04-16 DIAGNOSIS — M25512 Pain in left shoulder: Secondary | ICD-10-CM | POA: Diagnosis not present

## 2023-04-26 DIAGNOSIS — Z96612 Presence of left artificial shoulder joint: Secondary | ICD-10-CM | POA: Diagnosis not present

## 2023-04-26 DIAGNOSIS — M25512 Pain in left shoulder: Secondary | ICD-10-CM | POA: Diagnosis not present

## 2023-04-30 ENCOUNTER — Ambulatory Visit (HOSPITAL_BASED_OUTPATIENT_CLINIC_OR_DEPARTMENT_OTHER): Payer: Medicare HMO

## 2023-04-30 ENCOUNTER — Ambulatory Visit (HOSPITAL_BASED_OUTPATIENT_CLINIC_OR_DEPARTMENT_OTHER): Payer: Medicare HMO | Admitting: Orthopaedic Surgery

## 2023-04-30 DIAGNOSIS — M25512 Pain in left shoulder: Secondary | ICD-10-CM | POA: Diagnosis not present

## 2023-04-30 DIAGNOSIS — M19012 Primary osteoarthritis, left shoulder: Secondary | ICD-10-CM | POA: Diagnosis not present

## 2023-04-30 DIAGNOSIS — Z96612 Presence of left artificial shoulder joint: Secondary | ICD-10-CM | POA: Diagnosis not present

## 2023-04-30 DIAGNOSIS — Z471 Aftercare following joint replacement surgery: Secondary | ICD-10-CM | POA: Diagnosis not present

## 2023-04-30 NOTE — Progress Notes (Signed)
 Post Operative Evaluation    Procedure/Date of Surgery: Left reverse shoulder arthroplasty 12/10  Interval History:    Presents 12 weeks status post the above procedure.  Overall he is continuing to improve.  Overhead range of motion and strength is much improved at today's visit.  He has no pain with activities   PMH/PSH/Family History/Social History/Meds/Allergies:    Past Medical History:  Diagnosis Date   Arthritis    right hip   BPH (benign prostatic hyperplasia)    Cancer (HCC) 2005   melanoma removed right shoulder   COVID 1-18-2-22   fever chills body aches and cough x 5 days all symptoms resolved   Family history of adverse reaction to anesthesia    mother had brain fog after anesthesia lasted 4 to 5 dsys   Hypertension    Sleep apnea    Syncope    Wears glasses    Past Surgical History:  Procedure Laterality Date   APPENDECTOMY     CHOLECYSTECTOMY     REVERSE SHOULDER ARTHROPLASTY Left 02/09/2023   Procedure: LEFT REVERSE SHOULDER ARTHROPLASTY;  Surgeon: Huel Cote, MD;  [facility];  Service: Orthopedics;  Laterality: Left;   right shoulder fracture surgery  07/14/2003   right shoulder pins removed  2027   wires and screws also removed   TONSILLECTOMY  age 73   and adenoids removed   TRANSURETHRAL RESECTION OF PROSTATE N/A 04/12/2020   Procedure: TRANSURETHRAL RESECTION OF THE PROSTATE (TURP);  Surgeon: Bjorn Pippin, MD;  Location: [facility];  Service: Urology;  Laterality: N/A;   Social History   Socioeconomic History   Marital status: Widowed    Spouse name: Not on file   Number of children: Not on file   Years of education: Not on file   Highest education level: Not on file  Occupational History   Occupation: Textiles  Tobacco Use   Smoking status: Never   Smokeless tobacco: Never  Vaping Use   Vaping status: Never Used  Substance and Sexual Activity   Alcohol  use: Yes    Comment: rare   Drug use: Not Currently   Sexual activity: Not Currently  Other Topics Concern   Not on file  Social History Narrative   Not on file   Social Drivers of Health   Financial Resource Strain: Low Risk  (03/30/2023)   Received from [facility]   Overall Financial Resource Strain (CARDIA)    Difficulty of Paying Living Expenses: Not hard at all  Food Insecurity: No Food Insecurity (03/30/2023)   Received from [facility]   Hunger Vital Sign    Worried About Running Out of Food in the Last Year: Never true    Ran Out of Food in the Last Year: Never true  Transportation Needs: No Transportation Needs (03/30/2023)   Received from [facility] - [facility]    In the past 12 months, has lack of transportation kept you from medical appointments or from getting medications?: No    Lack of Transportation (Non-Medical): No  Physical Activity: Not on file  Stress: Not on file  Social Connections: Not on file   Family History  Problem Relation Age of Onset   Lymphoma Mother    Cancer Father 50  Melanoma   Allergies  Allergen Reactions   Contrast Media [Iodinated Contrast Media] Anaphylaxis   Meloxicam Diarrhea    Other Reaction(s): Other (See Comments)  GI upset   Current Outpatient Medications  Medication Sig Dispense Refill   amLODipine (NORVASC) 5 MG tablet Take 5 mg by mouth daily.     losartan-hydrochlorothiazide (HYZAAR) 100-25 MG tablet Take 1 tablet by mouth daily.     No current facility-administered medications for this visit.   No results found.  Review of Systems:   A ROS was performed including pertinent positives and negatives as documented in the HPI.   Musculoskeletal Exam:    There were no vitals taken for this visit.  Left shoulder incision is well-appearing without erythema or drainage.  Fires all 3 else of the deltoid.  In the supine position he can forward  elevate to 140 degrees with external rotation at side to 30 degrees.  2+ radial pulse.  Distal neurosensory exam is intact  Imaging:    3 views left shoulder: Status post reverse shoulder arthroplasty without evidence of complication  I personally reviewed and interpreted the radiographs.   Assessment:   12 weeks status post left reverse shoulder arthroplasty without evidence of complication overall doing extremely well.  At this time we will continue to work on strengthening.  I will plan to see him back in 3 months for reassessment Plan :    -Return to clinic 12 weeks for reassessment      I personally saw and evaluated the patient, and participated in the management and treatment plan.  Huel Cote, MD Attending Physician, Orthopedic Surgery  This document was dictated using Dragon voice recognition software. A reasonable attempt at proof reading has been made to minimize errors.

## 2023-05-10 DIAGNOSIS — Z96612 Presence of left artificial shoulder joint: Secondary | ICD-10-CM | POA: Diagnosis not present

## 2023-05-10 DIAGNOSIS — M25512 Pain in left shoulder: Secondary | ICD-10-CM | POA: Diagnosis not present

## 2023-05-12 DIAGNOSIS — M25512 Pain in left shoulder: Secondary | ICD-10-CM | POA: Diagnosis not present

## 2023-05-12 DIAGNOSIS — Z96612 Presence of left artificial shoulder joint: Secondary | ICD-10-CM | POA: Diagnosis not present

## 2023-05-14 DIAGNOSIS — Z96612 Presence of left artificial shoulder joint: Secondary | ICD-10-CM | POA: Diagnosis not present

## 2023-05-14 DIAGNOSIS — M25512 Pain in left shoulder: Secondary | ICD-10-CM | POA: Diagnosis not present

## 2023-05-24 DIAGNOSIS — Z96612 Presence of left artificial shoulder joint: Secondary | ICD-10-CM | POA: Diagnosis not present

## 2023-05-24 DIAGNOSIS — M25512 Pain in left shoulder: Secondary | ICD-10-CM | POA: Diagnosis not present

## 2023-05-28 DIAGNOSIS — Z96612 Presence of left artificial shoulder joint: Secondary | ICD-10-CM | POA: Diagnosis not present

## 2023-05-28 DIAGNOSIS — M25512 Pain in left shoulder: Secondary | ICD-10-CM | POA: Diagnosis not present

## 2023-05-31 DIAGNOSIS — Z96612 Presence of left artificial shoulder joint: Secondary | ICD-10-CM | POA: Diagnosis not present

## 2023-05-31 DIAGNOSIS — M25512 Pain in left shoulder: Secondary | ICD-10-CM | POA: Diagnosis not present

## 2023-06-04 DIAGNOSIS — Z96612 Presence of left artificial shoulder joint: Secondary | ICD-10-CM | POA: Diagnosis not present

## 2023-06-04 DIAGNOSIS — M25512 Pain in left shoulder: Secondary | ICD-10-CM | POA: Diagnosis not present

## 2023-06-07 DIAGNOSIS — Z96612 Presence of left artificial shoulder joint: Secondary | ICD-10-CM | POA: Diagnosis not present

## 2023-06-07 DIAGNOSIS — M25512 Pain in left shoulder: Secondary | ICD-10-CM | POA: Diagnosis not present

## 2023-06-11 DIAGNOSIS — Z96612 Presence of left artificial shoulder joint: Secondary | ICD-10-CM | POA: Diagnosis not present

## 2023-06-11 DIAGNOSIS — M25512 Pain in left shoulder: Secondary | ICD-10-CM | POA: Diagnosis not present

## 2023-06-14 DIAGNOSIS — Z96612 Presence of left artificial shoulder joint: Secondary | ICD-10-CM | POA: Diagnosis not present

## 2023-06-14 DIAGNOSIS — M25512 Pain in left shoulder: Secondary | ICD-10-CM | POA: Diagnosis not present

## 2023-06-16 DIAGNOSIS — E785 Hyperlipidemia, unspecified: Secondary | ICD-10-CM | POA: Diagnosis not present

## 2023-06-16 DIAGNOSIS — Z125 Encounter for screening for malignant neoplasm of prostate: Secondary | ICD-10-CM | POA: Diagnosis not present

## 2023-06-16 DIAGNOSIS — I10 Essential (primary) hypertension: Secondary | ICD-10-CM | POA: Diagnosis not present

## 2023-06-18 DIAGNOSIS — Z96612 Presence of left artificial shoulder joint: Secondary | ICD-10-CM | POA: Diagnosis not present

## 2023-06-18 DIAGNOSIS — M25512 Pain in left shoulder: Secondary | ICD-10-CM | POA: Diagnosis not present

## 2023-06-21 DIAGNOSIS — Z85820 Personal history of malignant melanoma of skin: Secondary | ICD-10-CM | POA: Diagnosis not present

## 2023-06-21 DIAGNOSIS — Z0000 Encounter for general adult medical examination without abnormal findings: Secondary | ICD-10-CM | POA: Diagnosis not present

## 2023-06-21 DIAGNOSIS — I10 Essential (primary) hypertension: Secondary | ICD-10-CM | POA: Diagnosis not present

## 2023-06-21 DIAGNOSIS — R7303 Prediabetes: Secondary | ICD-10-CM | POA: Diagnosis not present

## 2023-06-21 DIAGNOSIS — E785 Hyperlipidemia, unspecified: Secondary | ICD-10-CM | POA: Diagnosis not present

## 2023-06-28 DIAGNOSIS — M25512 Pain in left shoulder: Secondary | ICD-10-CM | POA: Diagnosis not present

## 2023-06-28 DIAGNOSIS — Z96612 Presence of left artificial shoulder joint: Secondary | ICD-10-CM | POA: Diagnosis not present

## 2023-06-30 ENCOUNTER — Ambulatory Visit: Attending: Cardiovascular Disease | Admitting: Cardiovascular Disease

## 2023-06-30 ENCOUNTER — Encounter: Payer: Self-pay | Admitting: Cardiovascular Disease

## 2023-06-30 VITALS — BP 116/82 | HR 81 | Ht 74.0 in | Wt 301.2 lb

## 2023-06-30 DIAGNOSIS — I712 Thoracic aortic aneurysm, without rupture, unspecified: Secondary | ICD-10-CM

## 2023-06-30 DIAGNOSIS — I471 Supraventricular tachycardia, unspecified: Secondary | ICD-10-CM

## 2023-06-30 DIAGNOSIS — I10 Essential (primary) hypertension: Secondary | ICD-10-CM

## 2023-06-30 NOTE — Patient Instructions (Signed)
 Medication Instructions:  No changes *If you need a refill on your cardiac medications before your next appointment, please call your pharmacy*  Lab Work: none If you have labs (blood work) drawn today and your tests are completely normal, you will receive your results only by: MyChart Message (if you have MyChart) OR A paper copy in the mail If you have any lab test that is abnormal or we need to change your treatment, we will call you to review the results.  Testing/Procedures: none  Follow-Up: At [facility], you and your health needs are our priority.  As part of our continuing mission to provide you with exceptional heart care, our providers are all part of one team.  This team includes your primary Cardiologist (physician) and Advanced Practice Providers or APPs (Physician Assistants and Nurse Practitioners) who all work together to provide you with the care you need, when you need it.  Your next appointment:   12 month(s)  Provider:   Antoinette Batman, MD    We recommend signing up for the patient portal called "MyChart".  Sign up information is provided on this After Visit Summary.  MyChart is used to connect with patients for Virtual Visits (Telemedicine).  Patients are able to view lab/test results, encounter notes, upcoming appointments, etc.  Non-urgent messages can be sent to your provider as well.   To learn more about what you can do with MyChart, go to [email or web address].   Other Instructions

## 2023-06-30 NOTE — Progress Notes (Signed)
 Chief Complaint  Patient presents with   Follow-up    SVT   History of Present Illness: 73 yo male with history of BPH, HTN, thoracic aortic aneurym and SVT who is here today for follow up. I saw him as a new patient for the evaluation of SVT in January 2023. He was seen in the [facility] ED 12/21/20 after a syncopal episode that occurred one day after his Covid 19 booster. He wore a cardiac monitor for 14 days in October/November 2022 and this showed several short runs of SVT. CT coronary calcium score of zero in May 2022. 4.1 cm ascending aortic aneurysm noted on non-contrast CT scan in May 2022. Echo 03/25/21 with LVEF=60-65%, moderate asymmetric hypertrophy of the basal septum. Trivial MR. Chest CTA January 2025 with stable 3.8-4.0 cm ascending aorta. This is followed in the CT surgery office by Dr. Sherene Dilling.   He is here today for follow up. The patient denies any chest pain, dyspnea, palpitations, lower extremity edema, orthopnea, PND, dizziness, near syncope or syncope.   I took care of his partner Grier Leber before he passed in April 2023.   Primary Care Physician: Imelda Man, MD  Past Medical History:  Diagnosis Date   Arthritis    right hip   BPH (benign prostatic hyperplasia)    Cancer (HCC) 2005   melanoma removed right shoulder   COVID 1-18-2-22   fever chills body aches and cough x 5 days all symptoms resolved   Family history of adverse reaction to anesthesia    mother had brain fog after anesthesia lasted 4 to 5 dsys   Hypertension    Sleep apnea    Syncope    Wears glasses     Past Surgical History:  Procedure Laterality Date   APPENDECTOMY     CHOLECYSTECTOMY     REVERSE SHOULDER ARTHROPLASTY Left 02/09/2023   Procedure: LEFT REVERSE SHOULDER ARTHROPLASTY;  Surgeon: Wilhelmenia Harada, MD;  Location: [facility];  Service: Orthopedics;  Laterality: Left;   right shoulder fracture surgery  07/14/2003   right shoulder pins removed  2027   wires and screws  also removed   TONSILLECTOMY  age 57   and adenoids removed   TRANSURETHRAL RESECTION OF PROSTATE N/A 04/12/2020   Procedure: TRANSURETHRAL RESECTION OF THE PROSTATE (TURP);  Surgeon: Homero Luster, MD;  Location: [facility];  Service: Urology;  Laterality: N/A;    Current Outpatient Medications  Medication Sig Dispense Refill   amLODipine  (NORVASC ) 5 MG tablet Take 5 mg by mouth daily.     losartan -hydrochlorothiazide  (HYZAAR) 100-25 MG tablet Take 1 tablet by mouth daily.     rosuvastatin (CRESTOR) 5 MG tablet Take 5 mg by mouth every other day.     azithromycin  (ZITHROMAX ) 250 MG tablet Take 250 mg by mouth as directed. Only for dental work (Patient not taking: Reported on 06/30/2023)     No current facility-administered medications for this visit.    Allergies  Allergen Reactions   Contrast Media [Iodinated Contrast Media] Anaphylaxis   Meloxicam  Diarrhea    Other Reaction(s): Other (See Comments)  GI upset    Social History   Socioeconomic History   Marital status: Widowed    Spouse name: Not on file   Number of children: Not on file   Years of education: Not on file   Highest education level: Not on file  Occupational History   Occupation: Textiles  Tobacco Use   Smoking status: Never  Smokeless tobacco: Never  Vaping Use   Vaping status: Never Used  Substance and Sexual Activity   Alcohol use: Yes    Comment: rare   Drug use: Not Currently   Sexual activity: Not Currently  Other Topics Concern   Not on file  Social History Narrative   Not on file   Social Drivers of Health   Financial Resource Strain: Low Risk  (03/30/2023)   Received from [facility]   Overall Financial Resource Strain (CARDIA)    Difficulty of Paying Living Expenses: Not hard at all  Food Insecurity: No Food Insecurity (03/30/2023)   Received from [facility]   Hunger Vital Sign    Worried About Running Out of Food in the Last Year:  Never true    Ran Out of Food in the Last Year: Never true  Transportation Needs: No Transportation Needs (03/30/2023)   Received from [facility] - Transportation    In the past 12 months, has lack of transportation kept you from medical appointments or from getting medications?: No    Lack of Transportation (Non-Medical): No  Physical Activity: Not on file  Stress: Not on file  Social Connections: Not on file  Intimate Partner Violence: Not At Risk (11/01/2022)   Humiliation, Afraid, Rape, and Kick questionnaire    Fear of Current or Ex-Partner: No    Emotionally Abused: No    Physically Abused: No    Sexually Abused: No    Family History  Problem Relation Age of Onset   Lymphoma Mother    Cancer Father 16       Melanoma    Review of Systems:  As stated in the HPI and otherwise negative.   BP 116/82   Pulse 81   Ht 6\' 2"  (1.88 m)   Wt (!) 136.6 kg   SpO2 (!) 89%   BMI 38.67 kg/m   Physical Examination: General: Well developed, well nourished, NAD  HEENT: OP clear, mucus membranes moist  SKIN: warm, dry. No rashes. Neuro: No focal deficits  Musculoskeletal: Muscle strength 5/5 all ext  Psychiatric: Mood and affect normal  Neck: No JVD, no carotid bruits, no thyromegaly, no lymphadenopathy.  Lungs:Clear bilaterally, no wheezes, rhonci, crackles Cardiovascular: Regular rate and rhythm. No murmurs, gallops or rubs. Abdomen:Soft. Bowel sounds present. Non-tender.  Extremities: No lower extremity edema. Pulses are 2 + in the bilateral DP/PT.  EKG:  EKG is not ordered today. The ekg ordered today demonstrates   Recent Labs: 11/01/2022: ALT 21 11/02/2022: Hemoglobin 13.4; Magnesium 2.0; Platelets 153 02/03/2023: BUN 13; Potassium 4.0; Sodium 138 03/25/2023: Creatinine, Ser 1.20   Lipid Panel    Component Value Date/Time   CHOL 163 11/01/2022 0249   TRIG 112 11/01/2022 0249   HDL 36 (L) 11/01/2022 0249   CHOLHDL 4.5 11/01/2022 0249   VLDL 22  11/01/2022 0249   LDLCALC 105 (H) 11/01/2022 0249     Wt Readings from Last 3 Encounters:  06/30/23 (!) 136.6 kg  04/01/23 134.4 kg  02/09/23 134.2 kg    Assessment and Plan:   1. SVT: No palpitations  2. Thoracic aortic aneurysm: Followed in CT surgery office. Stable in January 2025 by chest CTA  3. HTN: BP is well controlled. Continue Norvasc  and Losartan   Labs/ tests ordered today include:  No orders of the defined types were placed in this encounter.  Disposition:   F/U with me in one year.   Signed,  Antoinette Batman, MD 06/30/2023 10:19 AM    [facility] [address] [place], [place], [place]  [address] Phone: [phone]; Fax: [phone]

## 2023-07-02 DIAGNOSIS — Z96612 Presence of left artificial shoulder joint: Secondary | ICD-10-CM | POA: Diagnosis not present

## 2023-07-02 DIAGNOSIS — M25512 Pain in left shoulder: Secondary | ICD-10-CM | POA: Diagnosis not present

## 2023-07-05 DIAGNOSIS — M25512 Pain in left shoulder: Secondary | ICD-10-CM | POA: Diagnosis not present

## 2023-07-05 DIAGNOSIS — Z96612 Presence of left artificial shoulder joint: Secondary | ICD-10-CM | POA: Diagnosis not present

## 2023-07-08 DIAGNOSIS — Z96612 Presence of left artificial shoulder joint: Secondary | ICD-10-CM | POA: Diagnosis not present

## 2023-07-08 DIAGNOSIS — M25512 Pain in left shoulder: Secondary | ICD-10-CM | POA: Diagnosis not present

## 2023-07-15 DIAGNOSIS — M25512 Pain in left shoulder: Secondary | ICD-10-CM | POA: Diagnosis not present

## 2023-07-15 DIAGNOSIS — Z96612 Presence of left artificial shoulder joint: Secondary | ICD-10-CM | POA: Diagnosis not present

## 2023-07-19 DIAGNOSIS — M25512 Pain in left shoulder: Secondary | ICD-10-CM | POA: Diagnosis not present

## 2023-07-19 DIAGNOSIS — Z96612 Presence of left artificial shoulder joint: Secondary | ICD-10-CM | POA: Diagnosis not present

## 2023-07-30 ENCOUNTER — Ambulatory Visit (HOSPITAL_BASED_OUTPATIENT_CLINIC_OR_DEPARTMENT_OTHER)

## 2023-07-30 ENCOUNTER — Ambulatory Visit (HOSPITAL_BASED_OUTPATIENT_CLINIC_OR_DEPARTMENT_OTHER): Payer: Medicare HMO | Admitting: Orthopaedic Surgery

## 2023-07-30 DIAGNOSIS — Z96612 Presence of left artificial shoulder joint: Secondary | ICD-10-CM | POA: Diagnosis not present

## 2023-07-30 DIAGNOSIS — M19012 Primary osteoarthritis, left shoulder: Secondary | ICD-10-CM | POA: Diagnosis not present

## 2023-07-30 DIAGNOSIS — M25512 Pain in left shoulder: Secondary | ICD-10-CM | POA: Diagnosis not present

## 2023-07-30 NOTE — Progress Notes (Signed)
 Post Operative Evaluation    Procedure/Date of Surgery: Left reverse shoulder arthroplasty 12/10  Interval History:    Presents today for follow-up status post the above procedure.  He has been experiencing some soreness as he has gotten back to gardening.  He is on a new medication as well which is causing some muscular fatigue   PMH/PSH/Family History/Social History/Meds/Allergies:    Past Medical History:  Diagnosis Date   Arthritis    right hip   BPH (benign prostatic hyperplasia)    Cancer (HCC) 2005   melanoma removed right shoulder   COVID 1-18-2-22   fever chills body aches and cough x 5 days all symptoms resolved   Family history of adverse reaction to anesthesia    mother had brain fog after anesthesia lasted 4 to 5 dsys   Hypertension    Sleep apnea    Syncope    Wears glasses    Past Surgical History:  Procedure Laterality Date   APPENDECTOMY     CHOLECYSTECTOMY     REVERSE SHOULDER ARTHROPLASTY Left 02/09/2023   Procedure: LEFT REVERSE SHOULDER ARTHROPLASTY;  Surgeon: Wilhelmenia Harada, MD;  Location: [facility];  Service: Orthopedics;  Laterality: Left;   right shoulder fracture surgery  07/14/2003   right shoulder pins removed  2027   wires and screws also removed   TONSILLECTOMY  age 4   and adenoids removed   TRANSURETHRAL RESECTION OF PROSTATE N/A 04/12/2020   Procedure: TRANSURETHRAL RESECTION OF THE PROSTATE (TURP);  Surgeon: Homero Luster, MD;  Location: [facility];  Service: Urology;  Laterality: N/A;   Social History   Socioeconomic History   Marital status: Widowed    Spouse name: Not on file   Number of children: Not on file   Years of education: Not on file   Highest education level: Not on file  Occupational History   Occupation: Textiles  Tobacco Use   Smoking status: Never   Smokeless tobacco: Never  Vaping Use   Vaping status: Never Used  Substance and Sexual  Activity   Alcohol use: Yes    Comment: rare   Drug use: Not Currently   Sexual activity: Not Currently  Other Topics Concern   Not on file  Social History Narrative   Not on file   Social Drivers of Health   Financial Resource Strain: Low Risk  (03/30/2023)   Received from [facility]   Overall Financial Resource Strain (CARDIA)    Difficulty of Paying Living Expenses: Not hard at all  Food Insecurity: No Food Insecurity (03/30/2023)   Received from [facility]   Hunger Vital Sign    Worried About Running Out of Food in the Last Year: Never true    Ran Out of Food in the Last Year: Never true  Transportation Needs: No Transportation Needs (03/30/2023)   Received from [facility] - Transportation    In the past 12 months, has lack of transportation kept you from medical appointments or from getting medications?: No    Lack of Transportation (Non-Medical): No  Physical Activity: Not on file  Stress: Not on file  Social Connections: Not on file   Family History  Problem Relation Age of Onset   Lymphoma Mother  Cancer Father 66       Melanoma   Allergies  Allergen Reactions   Contrast Media [Iodinated Contrast Media] Anaphylaxis   Meloxicam  Diarrhea    Other Reaction(s): Other (See Comments)  GI upset   Current Outpatient Medications  Medication Sig Dispense Refill   amLODipine  (NORVASC ) 5 MG tablet Take 5 mg by mouth daily.     azithromycin  (ZITHROMAX ) 250 MG tablet Take 250 mg by mouth as directed. Only for dental work (Patient not taking: Reported on 06/30/2023)     losartan -hydrochlorothiazide  (HYZAAR) 100-25 MG tablet Take 1 tablet by mouth daily.     rosuvastatin (CRESTOR) 5 MG tablet Take 5 mg by mouth every other day.     No current facility-administered medications for this visit.   No results found.  Review of Systems:   A ROS was performed including pertinent positives and negatives as  documented in the HPI.   Musculoskeletal Exam:    There were no vitals taken for this visit.  Left shoulder incision is well-appearing without erythema or drainage.  Fires all 3 else of the deltoid.  In the supine position he can forward elevate to 140 degrees with external rotation at side to 30 degrees.  2+ radial pulse.  Distal neurosensory exam is intact  Imaging:    3 views left shoulder: Status post reverse shoulder arthroplasty without evidence of complication  I personally reviewed and interpreted the radiographs.   Assessment:   6 months status post left reverse shoulder arthroplasty without evidence of complication overall doing extremely well.  He is having some muscular soreness as his deltoid injury acclimating to its new functions coupled with a new medication that he is on.  I have advised that this is fairly normal and I would expect him to continue to recover and improve Plan :    -Return to clinic 6 months for reassessment      I personally saw and evaluated the patient, and participated in the management and treatment plan.  Wilhelmenia Harada, MD Attending Physician, Orthopedic Surgery  This document was dictated using Dragon voice recognition software. A reasonable attempt at proof reading has been made to minimize errors.

## 2023-08-02 DIAGNOSIS — M25512 Pain in left shoulder: Secondary | ICD-10-CM | POA: Diagnosis not present

## 2023-08-02 DIAGNOSIS — Z96612 Presence of left artificial shoulder joint: Secondary | ICD-10-CM | POA: Diagnosis not present

## 2023-08-05 DIAGNOSIS — M25512 Pain in left shoulder: Secondary | ICD-10-CM | POA: Diagnosis not present

## 2023-08-05 DIAGNOSIS — Z96612 Presence of left artificial shoulder joint: Secondary | ICD-10-CM | POA: Diagnosis not present

## 2023-08-09 DIAGNOSIS — M25512 Pain in left shoulder: Secondary | ICD-10-CM | POA: Diagnosis not present

## 2023-08-09 DIAGNOSIS — Z96612 Presence of left artificial shoulder joint: Secondary | ICD-10-CM | POA: Diagnosis not present

## 2023-08-12 DIAGNOSIS — Z96612 Presence of left artificial shoulder joint: Secondary | ICD-10-CM | POA: Diagnosis not present

## 2023-08-12 DIAGNOSIS — M25512 Pain in left shoulder: Secondary | ICD-10-CM | POA: Diagnosis not present

## 2023-08-16 DIAGNOSIS — M25512 Pain in left shoulder: Secondary | ICD-10-CM | POA: Diagnosis not present

## 2023-08-16 DIAGNOSIS — Z96612 Presence of left artificial shoulder joint: Secondary | ICD-10-CM | POA: Diagnosis not present

## 2023-08-26 DIAGNOSIS — M25512 Pain in left shoulder: Secondary | ICD-10-CM | POA: Diagnosis not present

## 2023-08-26 DIAGNOSIS — Z96612 Presence of left artificial shoulder joint: Secondary | ICD-10-CM | POA: Diagnosis not present

## 2023-08-30 DIAGNOSIS — M25512 Pain in left shoulder: Secondary | ICD-10-CM | POA: Diagnosis not present

## 2023-08-30 DIAGNOSIS — Z96612 Presence of left artificial shoulder joint: Secondary | ICD-10-CM | POA: Diagnosis not present

## 2023-09-02 DIAGNOSIS — Z96612 Presence of left artificial shoulder joint: Secondary | ICD-10-CM | POA: Diagnosis not present

## 2023-09-02 DIAGNOSIS — M25512 Pain in left shoulder: Secondary | ICD-10-CM | POA: Diagnosis not present

## 2023-09-06 DIAGNOSIS — M25512 Pain in left shoulder: Secondary | ICD-10-CM | POA: Diagnosis not present

## 2023-09-06 DIAGNOSIS — Z96612 Presence of left artificial shoulder joint: Secondary | ICD-10-CM | POA: Diagnosis not present

## 2023-09-09 DIAGNOSIS — M25512 Pain in left shoulder: Secondary | ICD-10-CM | POA: Diagnosis not present

## 2023-09-09 DIAGNOSIS — Z96612 Presence of left artificial shoulder joint: Secondary | ICD-10-CM | POA: Diagnosis not present

## 2023-09-30 ENCOUNTER — Ambulatory Visit: Admitting: Cardiovascular Disease

## 2023-09-30 DIAGNOSIS — M25512 Pain in left shoulder: Secondary | ICD-10-CM | POA: Diagnosis not present

## 2023-09-30 DIAGNOSIS — Z96612 Presence of left artificial shoulder joint: Secondary | ICD-10-CM | POA: Diagnosis not present

## 2023-10-04 DIAGNOSIS — M25512 Pain in left shoulder: Secondary | ICD-10-CM | POA: Diagnosis not present

## 2023-10-04 DIAGNOSIS — Z96612 Presence of left artificial shoulder joint: Secondary | ICD-10-CM | POA: Diagnosis not present

## 2023-10-07 DIAGNOSIS — M25611 Stiffness of right shoulder, not elsewhere classified: Secondary | ICD-10-CM | POA: Diagnosis not present

## 2023-10-07 DIAGNOSIS — M25612 Stiffness of left shoulder, not elsewhere classified: Secondary | ICD-10-CM | POA: Diagnosis not present

## 2023-10-11 DIAGNOSIS — M25611 Stiffness of right shoulder, not elsewhere classified: Secondary | ICD-10-CM | POA: Diagnosis not present

## 2023-10-11 DIAGNOSIS — M25612 Stiffness of left shoulder, not elsewhere classified: Secondary | ICD-10-CM | POA: Diagnosis not present

## 2023-10-18 DIAGNOSIS — M25612 Stiffness of left shoulder, not elsewhere classified: Secondary | ICD-10-CM | POA: Diagnosis not present

## 2023-10-18 DIAGNOSIS — M25611 Stiffness of right shoulder, not elsewhere classified: Secondary | ICD-10-CM | POA: Diagnosis not present

## 2023-10-20 DIAGNOSIS — M25611 Stiffness of right shoulder, not elsewhere classified: Secondary | ICD-10-CM | POA: Diagnosis not present

## 2023-10-20 DIAGNOSIS — M25612 Stiffness of left shoulder, not elsewhere classified: Secondary | ICD-10-CM | POA: Diagnosis not present

## 2023-10-25 DIAGNOSIS — M25611 Stiffness of right shoulder, not elsewhere classified: Secondary | ICD-10-CM | POA: Diagnosis not present

## 2023-10-25 DIAGNOSIS — M25612 Stiffness of left shoulder, not elsewhere classified: Secondary | ICD-10-CM | POA: Diagnosis not present

## 2023-11-08 DIAGNOSIS — M25612 Stiffness of left shoulder, not elsewhere classified: Secondary | ICD-10-CM | POA: Diagnosis not present

## 2023-11-08 DIAGNOSIS — M25611 Stiffness of right shoulder, not elsewhere classified: Secondary | ICD-10-CM | POA: Diagnosis not present

## 2023-11-10 DIAGNOSIS — M25611 Stiffness of right shoulder, not elsewhere classified: Secondary | ICD-10-CM | POA: Diagnosis not present

## 2023-11-10 DIAGNOSIS — M25612 Stiffness of left shoulder, not elsewhere classified: Secondary | ICD-10-CM | POA: Diagnosis not present

## 2024-01-03 ENCOUNTER — Encounter: Payer: Self-pay | Admitting: Radiology

## 2024-01-17 DIAGNOSIS — L814 Other melanin hyperpigmentation: Secondary | ICD-10-CM | POA: Diagnosis not present

## 2024-01-17 DIAGNOSIS — L821 Other seborrheic keratosis: Secondary | ICD-10-CM | POA: Diagnosis not present

## 2024-01-17 DIAGNOSIS — Z85820 Personal history of malignant melanoma of skin: Secondary | ICD-10-CM | POA: Diagnosis not present

## 2024-01-17 DIAGNOSIS — L570 Actinic keratosis: Secondary | ICD-10-CM | POA: Diagnosis not present

## 2024-01-31 ENCOUNTER — Encounter (HOSPITAL_BASED_OUTPATIENT_CLINIC_OR_DEPARTMENT_OTHER): Admitting: Orthopaedic Surgery

## 2024-02-02 ENCOUNTER — Encounter (HOSPITAL_BASED_OUTPATIENT_CLINIC_OR_DEPARTMENT_OTHER): Admitting: Orthopaedic Surgery

## 2024-02-17 ENCOUNTER — Encounter (HOSPITAL_BASED_OUTPATIENT_CLINIC_OR_DEPARTMENT_OTHER): Admitting: Orthopaedic Surgery

## 2024-03-03 ENCOUNTER — Ambulatory Visit (HOSPITAL_BASED_OUTPATIENT_CLINIC_OR_DEPARTMENT_OTHER)

## 2024-03-03 ENCOUNTER — Ambulatory Visit (HOSPITAL_BASED_OUTPATIENT_CLINIC_OR_DEPARTMENT_OTHER): Admitting: Orthopaedic Surgery

## 2024-03-03 DIAGNOSIS — M19012 Primary osteoarthritis, left shoulder: Secondary | ICD-10-CM

## 2024-03-03 NOTE — Progress Notes (Signed)
 "                                Post Operative Evaluation    Procedure/Date of Surgery: Left reverse shoulder arthroplasty 12/10  Interval History:    Presents today for follow-up status post the above procedure.  Overall he is doing extremely well.  Denies any pain in the shoulder.  He has been experiencing pain in the back and left lower extremity   PMH/PSH/Family History/Social History/Meds/Allergies:    Past Medical History:  Diagnosis Date   Arthritis    right hip   BPH (benign prostatic hyperplasia)    Cancer (HCC) 2005   melanoma removed right shoulder   COVID 1-18-2-22   fever chills body aches and cough x 5 days all symptoms resolved   Family history of adverse reaction to anesthesia    mother had brain fog after anesthesia lasted 4 to 5 dsys   Hypertension    Sleep apnea    Syncope    Wears glasses    Past Surgical History:  Procedure Laterality Date   APPENDECTOMY     CHOLECYSTECTOMY     REVERSE SHOULDER ARTHROPLASTY Left 02/09/2023   Procedure: LEFT REVERSE SHOULDER ARTHROPLASTY;  Surgeon: Genelle Standing, MD;  Location: [facility];  Service: Orthopedics;  Laterality: Left;   right shoulder fracture surgery  07/14/2003   right shoulder pins removed  2027   wires and screws also removed   TONSILLECTOMY  age 24   and adenoids removed   TRANSURETHRAL RESECTION OF PROSTATE N/A 04/12/2020   Procedure: TRANSURETHRAL RESECTION OF THE PROSTATE (TURP);  Surgeon: Watt Rush, MD;  Location[facility];  Service: Urology;  Laterality: N/A;   Social History   Socioeconomic History   Marital status: Widowed    Spouse name: Not on file   Number of children: Not on file   Years of education: Not on file   Highest education level: Not on file  Occupational History   Occupation: Textiles  Tobacco Use   Smoking status: Never   Smokeless tobacco: Never  Vaping Use   Vaping status: Never Used  Substance and Sexual Activity    Alcohol use: Yes    Comment: rare   Drug use: Not Currently   Sexual activity: Not Currently  Other Topics Concern   Not on file  Social History Narrative   Not on file   Social Drivers of Health   Tobacco Use: Low Risk (06/30/2023)   Patient History    Smoking Tobacco Use: Never    Smokeless Tobacco Use: Never    Passive Exposure: Not on file  Financial Resource Strain: Low Risk  (03/30/2023)   Received from [facility]   Overall Financial Resource Strain (CARDIA)    Difficulty of Paying Living Expenses: Not hard at all  Food Insecurity: No Food Insecurity (03/30/2023)   Received from [facility]   Epic    Within the past 12 months, you worried that your food would run out before you got the money to buy more.: Never true    Within the past 12 months, the food you bought just didn't last and you didn't have money to get more.: Never true  Transportation Needs: No Transportation Needs (03/30/2023)   [facility]    In the past 12 months, has lack of transportation  kept you from medical appointments or from getting medications?: No    Lack of Transportation (Non-Medical): No  Physical Activity: Not on file  Stress: Not on file  Social Connections: Not on file  Depression (EYV7-0): Not on file  Alcohol Screen: Not on file  Housing: Low Risk  (03/30/2023)   Received from [facility]   Epic    In the last 12 months, was there a time when you were not able to pay the mortgage or rent on time?: No    In the past 12 months, how many times have you moved where you were living?: 0    At any time in the past 12 months, were you homeless or living in a shelter (including now)?: No  Utilities: Not At Risk (03/30/2023)   Received from [facility] Utilities    Threatened with loss of utilities: No  Health Literacy: Not on file   Family History  Problem  Relation Age of Onset   Lymphoma Mother    Cancer Father 8       Melanoma   Allergies  Allergen Reactions   Contrast Media [Iodinated Contrast Media] Anaphylaxis   Meloxicam  Diarrhea    Other Reaction(s): Other (See Comments)  GI upset   Current Outpatient Medications  Medication Sig Dispense Refill   amLODipine  (NORVASC ) 5 MG tablet Take 5 mg by mouth daily.     azithromycin  (ZITHROMAX ) 250 MG tablet Take 250 mg by mouth as directed. Only for dental work (Patient not taking: Reported on 06/30/2023)     losartan -hydrochlorothiazide  (HYZAAR) 100-25 MG tablet Take 1 tablet by mouth daily.     rosuvastatin (CRESTOR) 5 MG tablet Take 5 mg by mouth every other day.     No current facility-administered medications for this visit.   No results found.  Review of Systems:   A ROS was performed including pertinent positives and negatives as documented in the HPI.   Musculoskeletal Exam:    There were no vitals taken for this visit.  Left shoulder incision is well-appearing without erythema or drainage.  Fires all 3 else of the deltoid.  In the supine position he can forward elevate to 140 degrees with external rotation at side to 30 degrees.  2+ radial pulse.  Distal neurosensory exam is intact  Imaging:    3 views left shoulder: Status post reverse shoulder arthroplasty without evidence of complication  I personally reviewed and interpreted the radiographs.   Assessment:   12 months status post left reverse shoulder arthroplasty without evidence of complication overall doing extremely well.  Will plan to see him back in 3 months to assess his left hip and back Plan :    -Return to clinic 3 months      I personally saw and evaluated the patient, and participated in the management and treatment plan.  Elspeth Parker, MD Attending Physician, Orthopedic Surgery  This document was dictated using Dragon voice recognition software. A reasonable attempt at proof reading has  been made to minimize errors. "

## 2024-03-03 NOTE — Addendum Note (Signed)
 Addended by: WOLFGANG CONLEY HERO on: 03/03/2024 12:57 PM   Modules accepted: Orders

## 2024-06-07 ENCOUNTER — Ambulatory Visit (HOSPITAL_BASED_OUTPATIENT_CLINIC_OR_DEPARTMENT_OTHER): Admitting: Orthopaedic Surgery
